# Patient Record
Sex: Female | Born: 2000 | Race: Black or African American | Hispanic: No | Marital: Single | Smoking: Never smoker
Health system: Southern US, Community
[De-identification: ages and names within clinical notes are randomized; demographics above are authoritative.]

## PROBLEM LIST (undated history)

## (undated) DIAGNOSIS — J45909 Unspecified asthma, uncomplicated: Secondary | ICD-10-CM

## (undated) HISTORY — PX: CHOLECYSTECTOMY: SHX55

---

## 2020-01-01 ENCOUNTER — Other Ambulatory Visit: Payer: Self-pay

## 2020-01-01 ENCOUNTER — Encounter (HOSPITAL_COMMUNITY): Payer: Self-pay | Admitting: *Deleted

## 2020-01-01 ENCOUNTER — Emergency Department (HOSPITAL_COMMUNITY)
Admission: EM | Admit: 2020-01-01 | Discharge: 2020-01-01 | Disposition: A | Discharge status: Home / Self Care | Payer: Medicaid Other | Attending: Emergency Medicine | Admitting: Emergency Medicine

## 2020-01-01 DIAGNOSIS — R509 Fever, unspecified: Secondary | ICD-10-CM | POA: Diagnosis present

## 2020-01-01 DIAGNOSIS — R519 Headache, unspecified: Secondary | ICD-10-CM | POA: Diagnosis not present

## 2020-01-01 DIAGNOSIS — J069 Acute upper respiratory infection, unspecified: Secondary | ICD-10-CM

## 2020-01-01 NOTE — ED Provider Notes (Signed)
[facility]-EMERGENCY DEPT Provider Note   CSN: [id number] Arrival date & time: 01/01/20  1216     History Chief Complaint  Patient presents with  . Headache  . Fever  . Chills    Wendy Long is a 19 y.o. female.  19 year old female presents with subjective chills as well as fever.  Mild cephalgia.  Denies any sore throat, congestion.  No vomiting or severe diarrhea.  Completed her Covid vaccination 2 weeks ago.  Is currently asymptomatic.  Concern for possible exposure to Covid.  Denies any urinary tract symptoms        History reviewed. No pertinent past medical history.  There are no problems to display for this patient.   Past Surgical History:  Procedure Laterality Date  . CHOLECYSTECTOMY       OB History   No obstetric history on file.     No family history on file.  Social History   Tobacco Use  . Smoking status: Never Smoker  . Smokeless tobacco: Never Used  Substance Use Topics  . Alcohol use: Never  . Drug use: Never    Home Medications Prior to Admission medications   Not on File    Allergies    Patient has no known allergies.  Review of Systems   Review of Systems  All other systems reviewed and are negative.   Physical Exam Updated Vital Signs BP 129/73 (BP Location: Left Arm)   Pulse 76   Temp 99.1 F (37.3 C) (Oral)   Resp 16   Ht 1.702 m (5\' 7" )   Wt 72.6 kg   LMP 11/10/2019   SpO2 98%   BMI 25.06 kg/m   Physical Exam Vitals and nursing note reviewed.  Constitutional:      General: She is not in acute distress.    Appearance: Normal appearance. She is well-developed. She is not toxic-appearing.  HENT:     Head: Normocephalic and atraumatic.  Eyes:     General: Lids are normal.     Conjunctiva/sclera: Conjunctivae normal.     Pupils: Pupils are equal, round, and reactive to light.  Neck:     Thyroid: No thyroid mass.     Trachea: No tracheal deviation.  Cardiovascular:     Rate and  Rhythm: Normal rate and regular rhythm.     Heart sounds: Normal heart sounds. No murmur heard.  No gallop.   Pulmonary:     Effort: Pulmonary effort is normal. No respiratory distress.     Breath sounds: Normal breath sounds. No stridor. No decreased breath sounds, wheezing, rhonchi or rales.  Abdominal:     General: Bowel sounds are normal. There is no distension.     Palpations: Abdomen is soft.     Tenderness: There is no abdominal tenderness. There is no rebound.  Musculoskeletal:        General: No tenderness. Normal range of motion.     Cervical back: Normal range of motion and neck supple.  Skin:    General: Skin is warm and dry.     Findings: No abrasion or rash.  Neurological:     Mental Status: She is alert and oriented to person, place, and time.     GCS: GCS eye subscore is 4. GCS verbal subscore is 5. GCS motor subscore is 6.     Cranial Nerves: No cranial nerve deficit.     Sensory: No sensory deficit.  Psychiatric:        Speech: Speech  normal.        Behavior: Behavior normal.     ED Results / Procedures / Treatments   Labs (all labs ordered are listed, but only abnormal results are displayed) Labs Reviewed - No data to display  EKG None  Radiology No results found.  Procedures Procedures (including critical care time)  Medications Ordered in ED Medications - No data to display  ED Course  I have reviewed the triage vital signs and the nursing notes.  Pertinent labs & imaging results that were available during my care of the patient were reviewed by me and considered in my medical decision making (see chart for details).    MDM Rules/Calculators/A&P                          Patient without indication for Covid testing here.  She is normal vital signs.  Will discharge home with return precautions Final Clinical Impression(s) / ED Diagnoses Final diagnoses:  None    Rx / DC Orders ED Discharge Orders    None       Lorre Nick,  MD 01/01/20 1431

## 2020-01-01 NOTE — ED Triage Notes (Signed)
Reports fever (has not checked temp) chills and headache for 2 days

## 2020-02-08 ENCOUNTER — Encounter: Payer: Medicaid Other | Admitting: Advanced Practice Midwife

## 2020-02-08 LAB — OB RESULTS CONSOLE RPR: RPR: NONREACTIVE

## 2020-02-08 LAB — OB RESULTS CONSOLE HEPATITIS B SURFACE ANTIGEN: Hepatitis B Surface Ag: NEGATIVE

## 2020-02-08 LAB — OB RESULTS CONSOLE HIV ANTIBODY (ROUTINE TESTING): HIV: NONREACTIVE

## 2020-02-08 LAB — OB RESULTS CONSOLE GC/CHLAMYDIA
Chlamydia: NEGATIVE
Gonorrhea: NEGATIVE

## 2020-02-08 LAB — OB RESULTS CONSOLE RUBELLA ANTIBODY, IGM: Rubella: IMMUNE

## 2020-03-02 ENCOUNTER — Inpatient Hospital Stay (HOSPITAL_COMMUNITY)
Admission: AD | Admit: 2020-03-02 | Discharge: 2020-03-02 | Discharge status: Against Medical Advice | Payer: Medicaid Other | Attending: Obstetrics and Gynecology | Admitting: Obstetrics and Gynecology

## 2020-03-02 ENCOUNTER — Other Ambulatory Visit: Payer: Self-pay

## 2020-03-02 ENCOUNTER — Encounter (HOSPITAL_COMMUNITY): Payer: Self-pay | Admitting: Obstetrics and Gynecology

## 2020-03-02 DIAGNOSIS — Z3492 Encounter for supervision of normal pregnancy, unspecified, second trimester: Secondary | ICD-10-CM

## 2020-03-02 DIAGNOSIS — O26892 Other specified pregnancy related conditions, second trimester: Secondary | ICD-10-CM | POA: Diagnosis not present

## 2020-03-02 DIAGNOSIS — R109 Unspecified abdominal pain: Secondary | ICD-10-CM | POA: Diagnosis not present

## 2020-03-02 DIAGNOSIS — Z3A18 18 weeks gestation of pregnancy: Secondary | ICD-10-CM | POA: Diagnosis not present

## 2020-03-02 DIAGNOSIS — O26899 Other specified pregnancy related conditions, unspecified trimester: Secondary | ICD-10-CM

## 2020-03-02 DIAGNOSIS — Z3A00 Weeks of gestation of pregnancy not specified: Secondary | ICD-10-CM | POA: Diagnosis not present

## 2020-03-02 LAB — URINALYSIS, ROUTINE W REFLEX MICROSCOPIC
Bilirubin Urine: NEGATIVE
Glucose, UA: NEGATIVE mg/dL
Hgb urine dipstick: NEGATIVE
Ketones, ur: NEGATIVE mg/dL
Leukocytes,Ua: NEGATIVE
Nitrite: NEGATIVE
Protein, ur: NEGATIVE mg/dL
Specific Gravity, Urine: 1.019 (ref 1.005–1.030)
pH: 6 (ref 5.0–8.0)

## 2020-03-02 NOTE — MAU Note (Signed)
Patient reports some excessive abdominal cramping that started 2-3 hours ago.  Denies any VB.  States she is [redacted] weeks pregnant.  Denies any complications w/ her pregnancy up to this point.

## 2020-03-02 NOTE — MAU Note (Signed)
Not enough urine for culture tube 

## 2020-03-02 NOTE — MAU Provider Note (Signed)
NP walked in the room approxiemtnly 50 mins after patients arrival to MAU. Patient stated " I think I am ready to leave, I feel much better".  She reports she felt a pain in her lower abdomen and panicked. She heard the fetal heart beat via doppler in MAU and feels relief. She declined any further workup.   Patient is very pleasant.  Vitals:   03/02/20 1811  BP: 117/64  Pulse: 86  Temp: 98.7 F (37.1 C)  Resp: 15  Weight: 70.5 kg    UA pending   A:  1. Abdominal pain during pregnancy in second trimester   2. Presence of fetal heart sounds in second trimester     P:  Patient signed out AMA Please return if you pain worsens MSE done.   Duane Lope, NP 03/02/2020 6:47 PM

## 2020-03-12 ENCOUNTER — Other Ambulatory Visit: Payer: Self-pay

## 2020-03-12 ENCOUNTER — Inpatient Hospital Stay (HOSPITAL_COMMUNITY)
Admission: AD | Admit: 2020-03-12 | Discharge: 2020-03-13 | Disposition: A | Discharge status: Home / Self Care | Payer: Medicaid Other | Attending: Obstetrics and Gynecology | Admitting: Obstetrics and Gynecology

## 2020-03-12 ENCOUNTER — Encounter (HOSPITAL_COMMUNITY): Payer: Self-pay | Admitting: Obstetrics and Gynecology

## 2020-03-12 DIAGNOSIS — Z3492 Encounter for supervision of normal pregnancy, unspecified, second trimester: Secondary | ICD-10-CM

## 2020-03-12 DIAGNOSIS — W19XXXA Unspecified fall, initial encounter: Secondary | ICD-10-CM

## 2020-03-12 HISTORY — DX: Unspecified asthma, uncomplicated: J45.909

## 2020-03-12 NOTE — MAU Note (Signed)
..  Wendy Long is a 20 y.o. at [redacted]w[redacted]d here in MAU reporting: A fall at around 10pm where she hit her stomach. She now has lower abdominal pain.  Denies vaginal bleeding.   Pain score: 2/10 Vitals:   03/12/20 2320  BP: 121/68  Pulse: 88  Resp: 14  Temp: 99.1 F (37.3 C)  SpO2: 100%

## 2020-03-13 DIAGNOSIS — Z3A17 17 weeks gestation of pregnancy: Secondary | ICD-10-CM

## 2020-03-13 DIAGNOSIS — O99891 Other specified diseases and conditions complicating pregnancy: Secondary | ICD-10-CM

## 2020-03-13 DIAGNOSIS — W19XXXA Unspecified fall, initial encounter: Secondary | ICD-10-CM

## 2020-03-13 DIAGNOSIS — Z3689 Encounter for other specified antenatal screening: Secondary | ICD-10-CM | POA: Diagnosis not present

## 2020-03-13 DIAGNOSIS — Z3492 Encounter for supervision of normal pregnancy, unspecified, second trimester: Secondary | ICD-10-CM | POA: Diagnosis present

## 2020-03-13 NOTE — MAU Provider Note (Cosign Needed)
Event Date/Time   First Provider Initiated Contact with Patient 03/13/20 0000   S Wendy Long is a 20 y.o. G1P0 pregnant female at [redacted]w[redacted]d who presents to MAU today with complaint of a recent fall where she landed mostly on her hands and knees but also hit her belly a little on the right side. She denies any abdominal pain or cramping, vaginal bleeding or discharge.   O BP 121/68 (BP Location: Right Arm)   Pulse 88   Temp 99.1 F (37.3 C)   Resp 14   Ht 5\' 7"  (1.702 m)   Wt 159 lb 9.6 oz (72.4 kg)   LMP 11/10/2019   SpO2 100%   BMI 25.00 kg/m  Physical Exam Vitals and nursing note reviewed.  Constitutional:      General: She is not in acute distress.    Appearance: Normal appearance. She is normal weight. She is not ill-appearing.  HENT:     Head: Normocephalic.     Mouth/Throat:     Mouth: Mucous membranes are moist.  Eyes:     Pupils: Pupils are equal, round, and reactive to light.  Cardiovascular:     Rate and Rhythm: Normal rate and regular rhythm.     Pulses: Normal pulses.  Pulmonary:     Effort: Pulmonary effort is normal.  Abdominal:     Palpations: Abdomen is soft.  Musculoskeletal:        General: No swelling, tenderness or signs of injury. Normal range of motion.     Cervical back: Normal range of motion.  Skin:    General: Skin is warm and dry.     Capillary Refill: Capillary refill takes less than 2 seconds.     Findings: No bruising or erythema.  Neurological:     Mental Status: She is alert and oriented to person, place, and time.  Psychiatric:        Mood and Affect: Mood normal.        Behavior: Behavior normal.        Thought Content: Thought content normal.        Judgment: Judgment normal.    FHT: 155  A Fall, initial encounter Fetal heart tones present, first trimester Medical screening exam complete  P Discharge from MAU in stable condition with preterm labor precautions Follow up with [facility] for ongoing  prenatal care See AVS for additional patient education  [facility], Bernerd Limbo 03/13/2020 12:24 AM

## 2020-03-13 NOTE — Discharge Instructions (Signed)
Preventing Injuries During Pregnancy  Injuries can happen during pregnancy. Minor falls and accidents usually do not harm you or your baby. But some injuries can harm you and your baby. Tell your doctor about any injury you suffer. What can I do to avoid injuries? Safety  Remove rugs and loose objects on the floor.  Wear comfortable shoes that have a good grip. Do not wear shoes that have high heels.  Always wear your seat belt in the car. The lap belt should be below your belly. Always drive safely.  Do not ride on a motorcycle. Activity  Do not take part in rough and violent activities or sports.  Avoid: ? Walking on wet or slippery floors. ? Lifting heavy pots of boiling or hot liquids. ? Fixing electrical problems. ? Being near fires. General instructions  Take over-the-counter and prescription medicines only as told by your doctor.  Know your blood type and the blood type of the baby's father.  If you are a victim of domestic violence: ? Call your local emergency services (911 in the [place] ? Contact the National Domestic Violence Hotline for help and support. Get help right away if:  You fall on your belly or receive any serious blow to your belly.  You have a stiff neck or neck pain after a fall or an injury.  You get a headache or have problems with vision after an injury.  You do not feel the baby move or the baby is not moving as much as normal.  You have been a victim of domestic violence or any other kind of attack.  You have been in a car accident.  You have bleeding from your vagina.  Fluid is leaking from your vagina.  You start to have cramping or pain in your belly (contractions).  You have very bad pain in your lower back.  You feel weak or pass out (faint).  You start to throw up (vomit) after an injury.  You have been burned. Summary  Some injuries that happen during pregnancy can do harm to the baby.  Tell your doctor about any  injury.  Take steps to avoid injury. This includes removing rugs and loose objects on the floor. Always wear your seat belt in the car.  Do not take part in rough and violent activities or sports.  Get help right away if you have any serious accident or injury. This information is not intended to replace advice given to you by your health care provider. Make sure you discuss any questions you have with your health care provider. Document Revised: 11/21/2018 Document Reviewed: 03/07/2016 Elsevier Patient Education  2020 Elsevier Inc.  

## 2020-04-04 ENCOUNTER — Encounter: Payer: Self-pay | Admitting: *Deleted

## 2020-04-04 ENCOUNTER — Encounter: Payer: Self-pay | Admitting: Obstetrics & Gynecology

## 2020-04-05 ENCOUNTER — Other Ambulatory Visit: Payer: Self-pay | Admitting: Obstetrics and Gynecology

## 2020-04-05 DIAGNOSIS — O365999 Maternal care for other known or suspected poor fetal growth, unspecified trimester, other fetus: Secondary | ICD-10-CM

## 2020-04-06 ENCOUNTER — Other Ambulatory Visit: Payer: Self-pay | Admitting: Obstetrics

## 2020-04-06 ENCOUNTER — Other Ambulatory Visit: Payer: Self-pay

## 2020-04-06 ENCOUNTER — Ambulatory Visit: Payer: Medicaid Other | Admitting: *Deleted

## 2020-04-06 ENCOUNTER — Ambulatory Visit: Payer: Medicaid Other | Attending: Obstetrics and Gynecology

## 2020-04-06 ENCOUNTER — Other Ambulatory Visit: Payer: Self-pay | Admitting: *Deleted

## 2020-04-06 ENCOUNTER — Ambulatory Visit: Payer: Medicaid Other

## 2020-04-06 ENCOUNTER — Encounter: Payer: Self-pay | Admitting: *Deleted

## 2020-04-06 VITALS — BP 122/71 | HR 81

## 2020-04-06 DIAGNOSIS — O365990 Maternal care for other known or suspected poor fetal growth, unspecified trimester, not applicable or unspecified: Secondary | ICD-10-CM | POA: Diagnosis present

## 2020-04-06 DIAGNOSIS — Z862 Personal history of diseases of the blood and blood-forming organs and certain disorders involving the immune mechanism: Secondary | ICD-10-CM | POA: Diagnosis not present

## 2020-04-06 DIAGNOSIS — Z3A19 19 weeks gestation of pregnancy: Secondary | ICD-10-CM | POA: Diagnosis not present

## 2020-04-06 DIAGNOSIS — O365999 Maternal care for other known or suspected poor fetal growth, unspecified trimester, other fetus: Secondary | ICD-10-CM | POA: Diagnosis present

## 2020-04-06 DIAGNOSIS — O365920 Maternal care for other known or suspected poor fetal growth, second trimester, not applicable or unspecified: Secondary | ICD-10-CM

## 2020-04-06 DIAGNOSIS — Z3492 Encounter for supervision of normal pregnancy, unspecified, second trimester: Secondary | ICD-10-CM

## 2020-05-09 ENCOUNTER — Other Ambulatory Visit: Payer: Self-pay

## 2020-05-09 ENCOUNTER — Ambulatory Visit: Payer: Medicaid Other | Admitting: *Deleted

## 2020-05-09 ENCOUNTER — Ambulatory Visit: Payer: Medicaid Other | Attending: Obstetrics

## 2020-05-09 ENCOUNTER — Encounter: Payer: Self-pay | Admitting: *Deleted

## 2020-05-09 VITALS — BP 115/64 | HR 96 | Temp 98.9°F

## 2020-05-09 DIAGNOSIS — O365920 Maternal care for other known or suspected poor fetal growth, second trimester, not applicable or unspecified: Secondary | ICD-10-CM

## 2020-05-09 DIAGNOSIS — Z3687 Encounter for antenatal screening for uncertain dates: Secondary | ICD-10-CM | POA: Diagnosis present

## 2020-05-09 DIAGNOSIS — O99012 Anemia complicating pregnancy, second trimester: Secondary | ICD-10-CM | POA: Insufficient documentation

## 2020-05-09 DIAGNOSIS — Z3492 Encounter for supervision of normal pregnancy, unspecified, second trimester: Secondary | ICD-10-CM | POA: Diagnosis not present

## 2020-05-09 DIAGNOSIS — D573 Sickle-cell trait: Secondary | ICD-10-CM | POA: Diagnosis not present

## 2020-05-09 DIAGNOSIS — Z3A24 24 weeks gestation of pregnancy: Secondary | ICD-10-CM | POA: Insufficient documentation

## 2020-05-09 NOTE — Progress Notes (Signed)
States"exposed to flu, has cough, runny nose, and headache."

## 2020-06-05 ENCOUNTER — Other Ambulatory Visit: Payer: Self-pay

## 2020-06-05 ENCOUNTER — Encounter (HOSPITAL_COMMUNITY): Payer: Self-pay | Admitting: Obstetrics & Gynecology

## 2020-06-05 ENCOUNTER — Inpatient Hospital Stay (HOSPITAL_COMMUNITY)
Admission: AD | Admit: 2020-06-05 | Discharge: 2020-06-05 | Disposition: A | Discharge status: Home / Self Care | Payer: Medicaid Other | Attending: Obstetrics & Gynecology | Admitting: Obstetrics & Gynecology

## 2020-06-05 DIAGNOSIS — Z3A27 27 weeks gestation of pregnancy: Secondary | ICD-10-CM | POA: Diagnosis not present

## 2020-06-05 DIAGNOSIS — O99612 Diseases of the digestive system complicating pregnancy, second trimester: Secondary | ICD-10-CM

## 2020-06-05 DIAGNOSIS — K529 Noninfective gastroenteritis and colitis, unspecified: Secondary | ICD-10-CM | POA: Insufficient documentation

## 2020-06-05 DIAGNOSIS — O219 Vomiting of pregnancy, unspecified: Secondary | ICD-10-CM

## 2020-06-05 DIAGNOSIS — O212 Late vomiting of pregnancy: Secondary | ICD-10-CM

## 2020-06-05 DIAGNOSIS — O218 Other vomiting complicating pregnancy: Secondary | ICD-10-CM | POA: Insufficient documentation

## 2020-06-05 LAB — URINALYSIS, ROUTINE W REFLEX MICROSCOPIC
Bilirubin Urine: NEGATIVE
Glucose, UA: NEGATIVE mg/dL
Hgb urine dipstick: NEGATIVE
Ketones, ur: 20 mg/dL — AB
Leukocytes,Ua: NEGATIVE
Nitrite: NEGATIVE
Protein, ur: NEGATIVE mg/dL
Specific Gravity, Urine: 1.012 (ref 1.005–1.030)
pH: 6 (ref 5.0–8.0)

## 2020-06-05 MED ORDER — ONDANSETRON HCL 4 MG PO TABS: 4.0000 mg | ORAL_TABLET | Freq: Three times a day (TID) | ORAL | 0 refills | Status: DC | PRN

## 2020-06-05 NOTE — MAU Note (Signed)
Pt presents to unit via EMS with complaint of n/v/d that started about 1 hour ago after receiving food from friend. Pt states n/v/d has since relieved after receiving 4 mg of Zofran and 1000 mL fluid bolus. No vaginal bleeding or LOF reported. Reports DFM.

## 2020-06-05 NOTE — MAU Provider Note (Signed)
Chief Complaint:  Nausea and Emesis   Event Date/Time   First Provider Initiated Contact with Patient 06/05/20 1648      HPI: Wendy Long is a 20 y.o. G1P0 at [redacted]w[redacted]d who presents to maternity admissions reporting onset of nausea with vomiting x 4-5 and diarrhea today around 3 pm.  She ate food her friend brought over at around 11 am then started feeling nauseous around 3.  She vomited x 2, then got in the shower and continued to have vomiting with new onset of diarrhea.  She came to MAU via EMS.  EMS started IV fluids and gave IV Zofran. Upon arrival, pt denies any nausea, vomiting, or diarrhea. She reports she had upper abdominal pain while she was vomiting but denies any pain now. There are no other symptoms.   She reports good fetal movement.   HPI  Past Medical History: Past Medical History:  Diagnosis Date  . Asthma    no medications    Past obstetric history: OB History  Gravida Para Term Preterm AB Living  1            SAB IAB Ectopic Multiple Live Births               # Outcome Date GA Lbr Len/2nd Weight Sex Delivery Anes PTL Lv  1 Current             Past Surgical History: Past Surgical History:  Procedure Laterality Date  . CHOLECYSTECTOMY      Family History: Family History  Problem Relation Age of Onset  . Depression Mother   . Diabetes Mother   . Hypertension Mother     Social History: Social History   Tobacco Use  . Smoking status: Never Smoker  . Smokeless tobacco: Never Used  Vaping Use  . Vaping Use: Never used  Substance Use Topics  . Alcohol use: Never  . Drug use: Never    Allergies: No Known Allergies  Meds:  Medications Prior to Admission  Medication Sig Dispense Refill Last Dose  . Prenatal Vit-Fe Fumarate-FA (PRENATAL VITAMINS PO) Take 1 tablet by mouth daily.   Past Week at Unknown time    ROS:  Review of Systems  Constitutional: Negative for chills, fatigue and fever.  Eyes: Negative for visual disturbance.  Respiratory:  Negative for shortness of breath.   Cardiovascular: Negative for chest pain.  Gastrointestinal: Positive for abdominal pain, diarrhea, nausea and vomiting.  Genitourinary: Negative for difficulty urinating, dysuria, flank pain, pelvic pain, vaginal bleeding, vaginal discharge and vaginal pain.  Neurological: Negative for dizziness and headaches.  Psychiatric/Behavioral: Negative.      I have reviewed patient's Past Medical Hx, Surgical Hx, Family Hx, Social Hx, medications and allergies.   Physical Exam   Patient Vitals for the past 24 hrs:  BP Temp Temp src Pulse Resp SpO2 Height Weight  06/05/20 1701 - - - - - 100 % - -  06/05/20 1656 - - - 90 - 100 % - -  06/05/20 1651 - - - - - 100 % - -  06/05/20 1646 - - - - - 100 % - -  06/05/20 1640 - - - - - 100 % - -  06/05/20 1639 - (!) 97.2 F (36.2 C) Axillary - - 100 % - -  06/05/20 1638 117/68 97.8 F (36.6 C) Oral 80 18 100 % - -  06/05/20 1636 - - - - - 100 % - -  06/05/20 1630 - - - - -  100 % - -  06/05/20 1626 122/71 - - 84 - 100 % - -  06/05/20 1624 122/71 (!) 97.5 F (36.4 C) Oral 91 16 100 %  (1.702 m) 74.4 kg   Constitutional: Well-developed, well-nourished female in no acute distress.  Cardiovascular: normal rate Respiratory: normal effort GI: Abd soft, non-tender, gravid appropriate for gestational age.  MS: Extremities nontender, no edema, normal ROM Neurologic: Alert and oriented x 4.  GU: Neg CVAT.  PELVIC EXAM: Deferred     FHT:  Baseline 140, moderate variability, accelerations present, no decelerations Contractions: None on toco or to palpation   Labs: Results for orders placed or performed during the hospital encounter of 06/05/20 (from the past 24 hour(s))  Urinalysis, Routine w reflex microscopic Urine, Clean Catch     Status: Abnormal   Collection Time: 06/05/20  5:27 PM  Result Value Ref Range   Color, Urine YELLOW YELLOW   APPearance CLEAR CLEAR   Specific Gravity, Urine 1.012 1.005 - 1.030    pH 6.0 5.0 - 8.0   Glucose, UA NEGATIVE NEGATIVE mg/dL   Hgb urine dipstick NEGATIVE NEGATIVE   Bilirubin Urine NEGATIVE NEGATIVE   Ketones, ur 20 (A) NEGATIVE mg/dL   Protein, ur NEGATIVE NEGATIVE mg/dL   Nitrite NEGATIVE NEGATIVE   Leukocytes,Ua NEGATIVE NEGATIVE      Imaging:  [place] MFM OB FOLLOW UP  Result Date: 05/09/2020 ----------------------------------------------------------------------  OBSTETRICS REPORT                       (Signed Final 05/09/2020 10:55 am) ---------------------------------------------------------------------- Patient Info  ID #:       [id number]                          D.O.B.:  13-May-2000 (19 yrs)  Name:       Wendy Long                  Visit Date: 05/09/2020 10:01 am ---------------------------------------------------------------------- Performed By  Attending:        Noralee Space MD        Ref. Address:     [facility]                                                             Gynecology                                                             [address]                                                             [address]                                                             [place], [place]                                                             [address]  Performed By:     Reinaldo Raddle            [facility]  Referred By:      Nigel Bridgeman                    CNM ---------------------------------------------------------------------- Orders  #  Description                           Code        Ordered By  1  [place] MFM OB FOLLOW UP                   51761.60    Rosana Hoes  ----------------------------------------------------------------------  #  Order #                     Accession #                Episode #  1  [id number]                   [id number]                 [id number] ---------------------------------------------------------------------- Indications  [redacted] weeks gestation of pregnancy  Z3A.24  Maternal care for known or suspected poor      O36.5921  fetal growth, second trimester, fetus 1 IUGR  (on outside office scan)  History of sickle cell trait                   Z86.2  Low-risk NIPS ---------------------------------------------------------------------- Fetal Evaluation  Num Of Fetuses:         1  Fetal Heart Rate(bpm):  157  Cardiac Activity:       Observed  Presentation:           Cephalic  Placenta:               Anterior  P. Cord Insertion:      Visualized  Amniotic Fluid  AFI FV:      Within normal limits                              Largest Pocket(cm)                              4.23 ---------------------------------------------------------------------- Biometry  BPD:      58.1  mm     G. Age:  23w 6d         36  %    CI:        72.35   %    70 - 86                                                          FL/HC:      20.2   %    18.7 - 20.9  HC:      217.3  mm     G. Age:  23w 5d         24  %    HC/AC:      1.12        1.05 - 1.21  AC:      193.6  mm     G. Age:  24w 0d         43  %    FL/BPD:     75.7   %    71 - 87  FL:         44  mm     G. Age:  24w 3d         53  %    FL/AC:      22.7   %    20 - 24  LV:        3.4  mm  Est. FW:     668  gm      1 lb 8 oz     49  % ---------------------------------------------------------------------- OB History  Gravidity:    1         Term:   0        Prem:   0        SAB:   0  TOP:          0       Ectopic:  0        Living: 0 ---------------------------------------------------------------------- Gestational Age  LMP:  25w 6d        Date:  11/10/19                 EDD:   08/16/20  U/S Today:     24w 0d                                         EDD:   08/29/20  Best:          24w 0d     Det. By:  U/S  (04/06/20)          EDD:   08/29/20 ---------------------------------------------------------------------- Anatomy  Cranium:               Appears normal         Aortic Arch:            Previously seen  Cavum:                 Appears normal         Ductal Arch:            Previously seen  Ventricles:            Appears normal         Diaphragm:              Appears normal  Choroid Plexus:        Previously seen        Stomach:                Appears normal, left                                                                        sided  Cerebellum:            Previously seen        Abdomen:                Appears normal  Posterior Fossa:       Previously seen        Abdominal Wall:         Previously seen  Nuchal Fold:           Previously seen        Cord Vessels:           Previously seen  Face:                  Orbits and profile     Kidneys:                Appear normal                         previously seen  Lips:                  Previously seen        Bladder:                Appears normal  Thoracic:              Appears normal  Spine:                  Previously seen  Heart:                 Previously seen        Upper Extremities:      Previously seen  RVOT:                  Appears normal         Lower Extremities:      Previously seen  LVOT:                  Appears normal  Other:  SVC IVC prev appears normal. 3VV/T not well visualized. Heels prev          visualized. 5th digit prev visualized. Fetus appears to be female. ---------------------------------------------------------------------- Cervix Uterus Adnexa  Cervix  Not visualized (advanced GA >24wks)  Uterus  No abnormality visualized.  Right Ovary  Not visualized.  Left Ovary  Within normal limits.  Cul De Sac  No free fluid seen.  Adnexa  No adnexal mass visualized. ----------------------------------------------------------------------  Impression  Patient returned for completion of fetal anatomy .Fetal growth  is appropriate for gestational age .Good interval growth is  seen.Amniotic fluid is normal and good fetal activity is seen  .Fetal anatomical survey was completed and appears normal.  Patient reports she has "runny nose" and cough but tested  negative for COVID-19 infection.  She has sickle-cell trait. I recommended partner screening.  Patient opted not to have genetic counseling.  EDD: 08/29/2020 ---------------------------------------------------------------------- Recommendations  Follow-up scans as clinically indicated. ----------------------------------------------------------------------                  Noralee Space, MD Electronically Signed Final Report   05/09/2020 10:55 am ----------------------------------------------------------------------   MAU Course/MDM: Orders Placed This Encounter  Procedures  . Urinalysis, Routine w reflex microscopic Urine, Clean Catch  . Discharge patient    Meds ordered this encounter  Medications  . ondansetron (ZOFRAN) 4 MG tablet    Sig: Take 1 tablet (4 mg total) by mouth every 8 (eight) hours as needed for nausea or vomiting.    Dispense:  20 tablet    Refill:  0    Order Specific Question:   Supervising Provider    Answer:   Adam Phenix [[id number]]     NST reviewed and appropriate for gestational age  Pt without symptoms following IV fluids and antiemetics from EMS No contractions per pt, to palpation, or on toco Likely food borne gastroenteritis Continue to PO hydrate Rx for Zofran PO PRN F/U in office as scheduled Return to MAU as needed for emergencies  Assessment: 1. Gastroenteritis, acute   2. Nausea/vomiting in pregnancy   3. [redacted] weeks gestation of pregnancy     Plan: Discharge home Labor precautions and fetal kick counts  Follow-up Information    [facility] Follow up.   Specialty: Obstetrics and Gynecology Why: Follow up as  scheduled. Return to MAU as needed for emergencies. Contact information: [address] [place] [address] [phone]             Allergies as of 06/05/2020   No Known Allergies     Medication List    TAKE these medications   ondansetron 4 MG tablet Commonly known as: Zofran Take 1 tablet (4 mg total) by mouth every 8 (eight) hours as needed for nausea or  vomiting.   PRENATAL VITAMINS PO Take 1 tablet by mouth daily.       Sharen Counter Certified Nurse-Midwife 06/05/2020 5:46 PM

## 2020-08-09 LAB — OB RESULTS CONSOLE GBS: GBS: NEGATIVE

## 2020-08-25 ENCOUNTER — Encounter (HOSPITAL_COMMUNITY): Payer: Self-pay | Admitting: Obstetrics and Gynecology

## 2020-08-25 ENCOUNTER — Other Ambulatory Visit: Payer: Self-pay

## 2020-08-25 ENCOUNTER — Inpatient Hospital Stay (HOSPITAL_COMMUNITY)
Admission: AD | Admit: 2020-08-25 | Discharge: 2020-08-25 | Disposition: A | Discharge status: Home / Self Care | Payer: Medicaid Other | Attending: Obstetrics and Gynecology | Admitting: Obstetrics and Gynecology

## 2020-08-25 DIAGNOSIS — O471 False labor at or after 37 completed weeks of gestation: Secondary | ICD-10-CM | POA: Diagnosis not present

## 2020-08-25 DIAGNOSIS — Z3493 Encounter for supervision of normal pregnancy, unspecified, third trimester: Secondary | ICD-10-CM

## 2020-08-25 DIAGNOSIS — O26893 Other specified pregnancy related conditions, third trimester: Secondary | ICD-10-CM | POA: Diagnosis not present

## 2020-08-25 DIAGNOSIS — R109 Unspecified abdominal pain: Secondary | ICD-10-CM | POA: Diagnosis not present

## 2020-08-25 DIAGNOSIS — Z3A39 39 weeks gestation of pregnancy: Secondary | ICD-10-CM | POA: Diagnosis not present

## 2020-08-25 DIAGNOSIS — O368130 Decreased fetal movements, third trimester, not applicable or unspecified: Secondary | ICD-10-CM | POA: Insufficient documentation

## 2020-08-25 DIAGNOSIS — R1030 Lower abdominal pain, unspecified: Secondary | ICD-10-CM | POA: Insufficient documentation

## 2020-08-25 DIAGNOSIS — Z3689 Encounter for other specified antenatal screening: Secondary | ICD-10-CM

## 2020-08-25 LAB — URINALYSIS, ROUTINE W REFLEX MICROSCOPIC
Bilirubin Urine: NEGATIVE
Glucose, UA: NEGATIVE mg/dL
Hgb urine dipstick: NEGATIVE
Ketones, ur: NEGATIVE mg/dL
Nitrite: NEGATIVE
Protein, ur: NEGATIVE mg/dL
Specific Gravity, Urine: 1.011 (ref 1.005–1.030)
pH: 5 (ref 5.0–8.0)

## 2020-08-25 LAB — POCT FERN TEST: POCT Fern Test: NEGATIVE

## 2020-08-25 NOTE — MAU Provider Note (Signed)
History     CSN: [id number]  Arrival date and time: 08/25/20 1324   Event Date/Time   First Provider Initiated Contact with Patient 08/25/20 1402      Chief Complaint  Patient presents with   Abdominal Pain   HPI Wendy Long is a 20 y.o. G1P0 at [redacted]w[redacted]d who presents to MAU with chief complaint of lower abdominal cramping.  This is a new problem, onset this morning around 0300 and only occurring when patient adopts a reclined position. Then around 1100 she started to feel suprapubic cramping regardless of position. Pain score is 6/10. She denies aggravating or alleviating factors  Leaking of Fluid This is a new problem, onset at 2300 hours yesterday. Patient endorses continuous leaking of scant amounts of fluid. She has visualized drops on the floor when she stands after voiding.  Decreased Fetal Movement Patient states she has not felt fetal movement since 0500. Baby is typically active in the evening. She endorses active fetal movement on arrival to MAU. She has not eaten today.  Patient receives care with [facility].  OB History     Gravida  1   Para      Term      Preterm      AB      Living         SAB      IAB      Ectopic      Multiple      Live Births              Past Medical History:  Diagnosis Date   Asthma    no medications    Past Surgical History:  Procedure Laterality Date   CHOLECYSTECTOMY      Family History  Problem Relation Age of Onset   Depression Mother    Diabetes Mother    Hypertension Mother     Social History   Tobacco Use   Smoking status: Never   Smokeless tobacco: Never  Vaping Use   Vaping Use: Never used  Substance Use Topics   Alcohol use: Never   Drug use: Never    Allergies: No Known Allergies  Medications Prior to Admission  Medication Sig Dispense Refill Last Dose   Prenatal Vit-Fe Fumarate-FA (PRENATAL VITAMINS PO) Take 1 tablet by mouth daily.   08/24/2020   ondansetron (ZOFRAN) 4 MG tablet Take  1 tablet (4 mg total) by mouth every 8 (eight) hours as needed for nausea or vomiting. 20 tablet 0     Review of Systems  Gastrointestinal:  Positive for abdominal pain.  Genitourinary:  Positive for vaginal discharge.  All other systems reviewed and are negative. Physical Exam   Blood pressure 127/78, pulse 79, temperature 98.9 F (37.2 C), temperature source Oral, resp. rate 17, last menstrual period 11/10/2019, SpO2 99 %.  Physical Exam Vitals and nursing note reviewed. Exam conducted with a chaperone present.  Constitutional:      Appearance: She is well-developed.  Cardiovascular:     Rate and Rhythm: Normal rate.     Heart sounds: Normal heart sounds.  Pulmonary:     Effort: Pulmonary effort is normal.     Breath sounds: Normal breath sounds.  Abdominal:     Palpations: Abdomen is soft.     Tenderness: There is no abdominal tenderness.     Comments: Gravid  Genitourinary:    Comments: Pelvic exam: External genitalia normal, vaginal walls pink and well rugated, cervix visually closed, no lesions noted.  Skin:    General: Skin is warm and dry.     Capillary Refill: Capillary refill takes less than 2 seconds.  Neurological:     Mental Status: She is alert and oriented to person, place, and time.  Psychiatric:        Mood and Affect: Mood normal.        Behavior: Behavior normal.    MAU Course/MDM  Procedures  --Negative pooling, negative fern --Reactive tracing: baseline 135, mod var, + 15 x 15 accels, no decels --Toco: irregular contractions q 2-8 min --Patient endorses fetal movement on arrival to MAU and throughout evaluation. Given NST button once triage was completed. Push NST button 39 times during labor evaluation  Patient Vitals for the past 24 hrs:  BP Temp Temp src Pulse Resp SpO2  08/25/20 1512 129/73 -- -- 77 -- --  08/25/20 1402 127/78 -- -- 79 -- --  08/25/20 1357 131/86 98.9 F (37.2 C) Oral 85 17 99 %   Results for orders placed or performed  during the hospital encounter of 08/25/20 (from the past 24 hour(s))  Urinalysis, Routine w reflex microscopic Urine, Clean Catch     Status: Abnormal   Collection Time: 08/25/20  2:14 PM  Result Value Ref Range   Color, Urine YELLOW YELLOW   APPearance HAZY (A) CLEAR   Specific Gravity, Urine 1.011 1.005 - 1.030   pH 5.0 5.0 - 8.0   Glucose, UA NEGATIVE NEGATIVE mg/dL   Hgb urine dipstick NEGATIVE NEGATIVE   Bilirubin Urine NEGATIVE NEGATIVE   Ketones, ur NEGATIVE NEGATIVE mg/dL   Protein, ur NEGATIVE NEGATIVE mg/dL   Nitrite NEGATIVE NEGATIVE   Leukocytes,Ua LARGE (A) NEGATIVE   RBC / HPF 0-5 0 - 5 RBC/hpf   WBC, UA 6-10 0 - 5 WBC/hpf   Bacteria, UA FEW (A) NONE SEEN   Squamous Epithelial / LPF 0-5 0 - 5   Mucus PRESENT   Fern Test     Status: None   Collection Time: 08/25/20  3:33 PM  Result Value Ref Range   POCT Fern Test Negative = intact amniotic membranes    Assessment and Plan  --20 y.o. G1P0 at [redacted]w[redacted]d  --Reactive tracing --Intact amniotic sac --Cervix 1cm, up to 8 min between contractions --Pain score 0 prior to discharge --Discharge home in stable condition  Calvert Cantor, CNM 08/25/2020, 8:54 PM

## 2020-08-25 NOTE — MAU Note (Addendum)
...  Wendy Long is a 20 y.o. at [redacted]w[redacted]d here in MAU reporting: intermittent lower back and abdominal pain that began at 0300 this morning while lying down only. She states around 1100 this morning she began having intermittent pain in any position. Denies back pain currently but endorses the abdominal pain.  She states at 2300 yesterday she had leaking of fluid after using the restroom. She states she would get up from urinating and more would come out but that the leaking only happened in the restroom. DFM. States she has not felt the baby move since 0500. No VB. GBS-.  Last ate last night at 2130.  Lab orders placed from triage: UA

## 2020-08-27 LAB — CULTURE, OB URINE

## 2020-08-29 ENCOUNTER — Other Ambulatory Visit: Payer: Self-pay | Admitting: Obstetrics and Gynecology

## 2020-09-05 ENCOUNTER — Other Ambulatory Visit: Payer: Self-pay

## 2020-09-05 ENCOUNTER — Inpatient Hospital Stay (HOSPITAL_COMMUNITY): Payer: Medicaid Other | Admitting: Anesthesiology

## 2020-09-05 ENCOUNTER — Inpatient Hospital Stay (HOSPITAL_COMMUNITY)
Admission: AD | Admit: 2020-09-05 | Payer: Medicaid Other | Source: Home / Self Care | Admitting: Obstetrics and Gynecology

## 2020-09-05 ENCOUNTER — Encounter (HOSPITAL_COMMUNITY): Payer: Self-pay | Admitting: Obstetrics and Gynecology

## 2020-09-05 ENCOUNTER — Inpatient Hospital Stay (HOSPITAL_COMMUNITY): Payer: Medicaid Other | Attending: Obstetrics and Gynecology

## 2020-09-05 ENCOUNTER — Inpatient Hospital Stay (HOSPITAL_COMMUNITY)
Admission: AD | Admit: 2020-09-05 | Discharge: 2020-09-08 | DRG: 787 | Disposition: A | Discharge status: Home / Self Care | Payer: Medicaid Other | Attending: Obstetrics and Gynecology | Admitting: Obstetrics and Gynecology

## 2020-09-05 DIAGNOSIS — O9081 Anemia of the puerperium: Secondary | ICD-10-CM | POA: Diagnosis not present

## 2020-09-05 DIAGNOSIS — D573 Sickle-cell trait: Secondary | ICD-10-CM | POA: Diagnosis present

## 2020-09-05 DIAGNOSIS — Z3A41 41 weeks gestation of pregnancy: Secondary | ICD-10-CM | POA: Diagnosis not present

## 2020-09-05 DIAGNOSIS — O480 Post-term pregnancy: Principal | ICD-10-CM | POA: Diagnosis present

## 2020-09-05 DIAGNOSIS — Z20822 Contact with and (suspected) exposure to covid-19: Secondary | ICD-10-CM | POA: Diagnosis present

## 2020-09-05 DIAGNOSIS — Z98891 History of uterine scar from previous surgery: Secondary | ICD-10-CM

## 2020-09-05 DIAGNOSIS — D62 Acute posthemorrhagic anemia: Secondary | ICD-10-CM | POA: Diagnosis not present

## 2020-09-05 DIAGNOSIS — O26893 Other specified pregnancy related conditions, third trimester: Secondary | ICD-10-CM | POA: Diagnosis present

## 2020-09-05 LAB — RESP PANEL BY RT-PCR (FLU A&B, COVID) ARPGX2
Influenza A by PCR: NEGATIVE
Influenza B by PCR: NEGATIVE
SARS Coronavirus 2 by RT PCR: NEGATIVE

## 2020-09-05 LAB — RPR: RPR Ser Ql: NONREACTIVE

## 2020-09-05 LAB — CBC
HCT: 37.1 % (ref 36.0–46.0)
Hemoglobin: 12 g/dL (ref 12.0–15.0)
MCH: 27.7 pg (ref 26.0–34.0)
MCHC: 32.3 g/dL (ref 30.0–36.0)
MCV: 85.7 fL (ref 80.0–100.0)
Platelets: 154 10*3/uL (ref 150–400)
RBC: 4.33 MIL/uL (ref 3.87–5.11)
RDW: 13.6 % (ref 11.5–15.5)
WBC: 7.7 10*3/uL (ref 4.0–10.5)
nRBC: 0 % (ref 0.0–0.2)

## 2020-09-05 LAB — TYPE AND SCREEN
ABO/RH(D): B POS
Antibody Screen: NEGATIVE

## 2020-09-05 LAB — POCT FERN TEST: POCT Fern Test: POSITIVE

## 2020-09-05 MED ORDER — LACTATED RINGERS IV SOLN: 500.0000 mL | INTRAVENOUS | Status: DC | PRN

## 2020-09-05 MED ORDER — FENTANYL-BUPIVACAINE-NACL 0.5-0.125-0.9 MG/250ML-% EP SOLN: 12.0000 mL/h | EPIDURAL | Status: DC | PRN

## 2020-09-05 MED ORDER — ONDANSETRON HCL 4 MG/2ML IJ SOLN
4.0000 mg | Freq: Four times a day (QID) | INTRAMUSCULAR | Status: DC | PRN
  Administered 2020-09-06: 4 mg via INTRAVENOUS
  Filled 2020-09-05: qty 2

## 2020-09-05 MED ORDER — ACETAMINOPHEN 325 MG PO TABS: 650.0000 mg | ORAL_TABLET | ORAL | Status: DC | PRN

## 2020-09-05 MED ORDER — OXYTOCIN BOLUS FROM INFUSION: 333.0000 mL | Freq: Once | INTRAVENOUS | Status: DC

## 2020-09-05 MED ORDER — OXYTOCIN-SODIUM CHLORIDE 30-0.9 UT/500ML-% IV SOLN
2.5000 [IU]/h | INTRAVENOUS | Status: DC
  Filled 2020-09-05: qty 500

## 2020-09-05 MED ORDER — FENTANYL CITRATE (PF) 100 MCG/2ML IJ SOLN
50.0000 ug | INTRAMUSCULAR | Status: DC | PRN
  Administered 2020-09-05: 50 ug via INTRAVENOUS
  Filled 2020-09-05: qty 2

## 2020-09-05 MED ORDER — FLEET ENEMA 7-19 GM/118ML RE ENEM: 1.0000 | ENEMA | RECTAL | Status: DC | PRN

## 2020-09-05 MED ORDER — PHENYLEPHRINE 40 MCG/ML (10ML) SYRINGE FOR IV PUSH (FOR BLOOD PRESSURE SUPPORT): 80.0000 ug | PREFILLED_SYRINGE | INTRAVENOUS | Status: DC | PRN

## 2020-09-05 MED ORDER — LACTATED RINGERS IV SOLN: INTRAVENOUS | Status: DC

## 2020-09-05 MED ORDER — FENTANYL-BUPIVACAINE-NACL 0.5-0.125-0.9 MG/250ML-% EP SOLN
12.0000 mL/h | EPIDURAL | Status: DC | PRN
  Administered 2020-09-05: 12 mL/h via EPIDURAL

## 2020-09-05 MED ORDER — EPHEDRINE 5 MG/ML INJ: 10.0000 mg | INTRAVENOUS | Status: DC | PRN

## 2020-09-05 MED ORDER — SOD CITRATE-CITRIC ACID 500-334 MG/5ML PO SOLN
30.0000 mL | ORAL | Status: DC | PRN
  Administered 2020-09-06: 30 mL via ORAL
  Filled 2020-09-05: qty 30

## 2020-09-05 MED ORDER — DIPHENHYDRAMINE HCL 50 MG/ML IJ SOLN: 12.5000 mg | INTRAMUSCULAR | Status: DC | PRN

## 2020-09-05 MED ORDER — OXYCODONE-ACETAMINOPHEN 5-325 MG PO TABS: 2.0000 | ORAL_TABLET | ORAL | Status: DC | PRN

## 2020-09-05 MED ORDER — LIDOCAINE HCL (PF) 1 % IJ SOLN
INTRAMUSCULAR | Status: DC | PRN
  Administered 2020-09-05: 8 mL via EPIDURAL

## 2020-09-05 MED ORDER — OXYCODONE-ACETAMINOPHEN 5-325 MG PO TABS: 1.0000 | ORAL_TABLET | ORAL | Status: DC | PRN

## 2020-09-05 MED ORDER — FENTANYL-BUPIVACAINE-NACL 0.5-0.125-0.9 MG/250ML-% EP SOLN
EPIDURAL | Status: AC
  Filled 2020-09-05: qty 250

## 2020-09-05 MED ORDER — LIDOCAINE HCL (PF) 1 % IJ SOLN: 30.0000 mL | INTRAMUSCULAR | Status: DC | PRN

## 2020-09-05 MED ORDER — LACTATED RINGERS IV SOLN
500.0000 mL | Freq: Once | INTRAVENOUS | Status: AC
  Administered 2020-09-06: 500 mL via INTRAVENOUS

## 2020-09-05 NOTE — H&P (Signed)
OB ADMISSION/ HISTORY & PHYSICAL:  Admission Date: 09/05/2020  1:50 PM  Admit Diagnosis: Normal labor  Martia Dalby is a 20 y.o. female G1P0 [redacted]w[redacted]d presenting for labor eval. RN noted clear fluid and membranes at introitus. Positive ferning. Pt was unaware of SROM, but reports LOF since 0300 today. Endorses active FM, denies vaginal bleeding. Ctx began @ 0400 and has increased in intensity. Pt was scheduled for IOL today. Pregnancy was initially dated by LMP, biometrics performed during anatomy US changed EDC to 08/29/20.  History of current pregnancy: G1P0   Patient entered care with [facility] at 10 wks.   Anatomy scan:  19 wks, complete w/ anterior placenta.   Last evaluation: 37+1 wks, vertex/anterior placenta/ AFI 14.6/EFW 6# 6 oz (34%) Significant prenatal events:  Patient Active Problem List   Diagnosis Date Noted   Normal labor 09/05/2020  Hx of asthma  Prenatal Labs: ABO, Rh: --/--/B POS (06/27 1503) Antibody: NEG (06/27 1503) Rubella:   immune RPR:   NR HBsAg:   NR HIV:   NR GTT: passed 1 hr GBS:   neg GC/CHL: neg/neg Genetics: low-risk, neg AFP, normal Horizon Tdap/influenza vaccines: UTD on both   OB History  Gravida Para Term Preterm AB Living  1            SAB IAB Ectopic Multiple Live Births               # Outcome Date GA Lbr Len/2nd Weight Sex Delivery Anes PTL Lv  1 Current             Medical / Surgical History: Past medical history:  Past Medical History:  Diagnosis Date   Asthma    no medications    Past surgical history:  Past Surgical History:  Procedure Laterality Date   CHOLECYSTECTOMY     Family History:  Family History  Problem Relation Age of Onset   Depression Mother    Diabetes Mother    Hypertension Mother     Social History:  reports that she has never smoked. She has never used smokeless tobacco. She reports that she does not drink alcohol and does not use drugs.  Allergies: Patient has no known allergies.   Current  Medications at time of admission:  Prior to Admission medications   Medication Sig Start Date End Date Taking? Authorizing Provider  Prenatal Vit-Fe Fumarate-FA (PRENATAL VITAMINS PO) Take 1 tablet by mouth daily.   Yes [provider]  ondansetron (ZOFRAN) 4 MG tablet Take 1 tablet (4 mg total) by mouth every 8 (eight) hours as needed for nausea or vomiting. 06/05/20   Leftwich-Kirby, Wilmer Floor, CNM    Review of Systems: Constitutional: Negative   HENT: Negative   Eyes: Negative   Respiratory: Negative   Cardiovascular: Negative   Gastrointestinal: Negative  Genitourinary: neg for bloody show, pos for LOF   Musculoskeletal: Negative   Skin: Negative   Neurological: Negative   Endo/Heme/Allergies: Negative   Psychiatric/Behavioral: Negative    Physical Exam: VS: Blood pressure 125/80, pulse 77, temperature 98.5 F (36.9 C), temperature source Oral, resp. rate 17, height 5\' 7"  (1.702 m), weight 78.4 kg, last menstrual period 11/10/2019, SpO2 100 %. AAO x3, no signs of distress Cardiovascular: RRR Respiratory: Lung fields clear to ausculation GU/GI: Abdomen gravid, non-tender, non-distended, active FM, vertex, EFW 7# per Leopold's Extremities: no edema, negative for pain, tenderness, and cords  Cervical exam:Dilation: 4 Effacement (%): 80 Station: -1 Exam by:: [email or web address], CNM FHR: baseline rate  140 / variability moderate / accelerations present / absent decelerations TOCO: 2-4   Prenatal Transfer Tool  Maternal Diabetes: No Genetic Screening: Normal Maternal Ultrasounds/Referrals: Normal Fetal Ultrasounds or other Referrals:  Referred to Materal Fetal Medicine  for completion of anatomy and growth U/S Maternal Substance Abuse:  No Significant Maternal Medications:  None Significant Maternal Lab Results: Group B Strep negative    Assessment: 20 y.o. G1P0 [redacted]w[redacted]d  Latent stage of labor FHR category 1 GBS neg Pain management plan: IV sedation   Plan:  Admit  to L&D Routine admission orders Epidural PRN Expectant management. Agrees to Pitocin augmentation PRN Dr Su Hilt notified of admission and plan of care  Roma Schanz MSN, CNM 09/05/2020 4:53 PM

## 2020-09-05 NOTE — Anesthesia Procedure Notes (Signed)
Epidural Patient location during procedure: OB Start time: 09/05/2020 8:50 PM End time: 09/05/2020 8:56 PM  Staffing Anesthesiologist: Bethena Midget, MD  Preanesthetic Checklist Completed: patient identified, IV checked, site marked, risks and benefits discussed, surgical consent, monitors and equipment checked, pre-op evaluation and timeout performed  Epidural Patient position: sitting Prep: DuraPrep and site prepped and draped Patient monitoring: continuous pulse ox and blood pressure Approach: midline Location: L4-L5 Injection technique: LOR air  Needle:  Needle type: Tuohy  Needle gauge: 17 G Needle length: 9 cm and 9 Needle insertion depth: 6 cm Catheter type: closed end flexible Catheter size: 19 Gauge Catheter at skin depth: 12 cm Test dose: negative  Assessment Events: blood not aspirated, injection not painful, no injection resistance, no paresthesia and negative IV test

## 2020-09-05 NOTE — Progress Notes (Signed)
Subjective:    Not coping well w/ ctx and states that IV sedation was not effective for pain relief. Considering epidural and agrees to Pitocin if/when she gets an epidural. Plan to recheck after 2300 and start Pitocin if no change, pt agrees.   Objective:    VS: BP 125/75   Pulse 74   Temp 98.5 F (36.9 C) (Oral)   Resp 17   Ht 5\' 7"  (1.702 m)   Wt 78.4 kg   LMP 11/10/2019   SpO2 100%   BMI 27.06 kg/m  FHR : baseline 135 / variability moderate / accelerations present / absent decelerations Toco: contractions every 1-3 minutes  Membranes: SROM x17 hrs, remains clear Dilation: 4 Effacement (%): 80 Station: -1 Presentation: Vertex Exam by:: [email or web address], CNM   Assessment/Plan:   20 y.o. G1P0 [redacted]w[redacted]d Normal labor SROM  Labor:  latent labor Fetal Wellbeing:  Category I Pain Control:  IV pain meds I/D:   GBS neg Anticipated MOD:  NSVD  [redacted]w[redacted]d MSN, CNM 09/05/2020 8:25 PM

## 2020-09-05 NOTE — Anesthesia Preprocedure Evaluation (Signed)
Anesthesia Evaluation  Patient identified by MRN, date of birth, ID band Patient awake    Reviewed: Allergy & Precautions, H&P , NPO status , Patient's Chart, lab work & pertinent test results, reviewed documented beta blocker date and time   Airway Mallampati: I  TM Distance: >3 FB Neck ROM: full    Dental no notable dental hx. (+) Teeth Intact, Dental Advisory Given   Pulmonary neg pulmonary ROS,    Pulmonary exam normal breath sounds clear to auscultation       Cardiovascular negative cardio ROS Normal cardiovascular exam Rhythm:regular Rate:Normal     Neuro/Psych negative neurological ROS  negative psych ROS   GI/Hepatic negative GI ROS, Neg liver ROS,   Endo/Other  negative endocrine ROS  Renal/GU negative Renal ROS  negative genitourinary   Musculoskeletal   Abdominal   Peds  Hematology negative hematology ROS (+)   Anesthesia Other Findings   Reproductive/Obstetrics (+) Pregnancy                             Anesthesia Physical Anesthesia Plan  ASA: 2  Anesthesia Plan: Epidural   Post-op Pain Management:    Induction:   PONV Risk Score and Plan: 2  Airway Management Planned: Natural Airway  Additional Equipment: None  Intra-op Plan:   Post-operative Plan:   Informed Consent: I have reviewed the patients History and Physical, chart, labs and discussed the procedure including the risks, benefits and alternatives for the proposed anesthesia with the patient or authorized representative who has indicated his/her understanding and acceptance.       Plan Discussed with: Anesthesiologist and CRNA  Anesthesia Plan Comments:         Anesthesia Quick Evaluation  

## 2020-09-05 NOTE — MAU Note (Signed)
Presents with c/o ctxs 10 minutes apart that began this morning @ 0400.  Denies VB or LOF, losing mucus plug.  Endorses +FM.

## 2020-09-06 ENCOUNTER — Encounter (HOSPITAL_COMMUNITY)
Admission: AD | Disposition: A | Discharge status: Home / Self Care | Payer: Self-pay | Source: Home / Self Care | Attending: Obstetrics and Gynecology

## 2020-09-06 ENCOUNTER — Encounter (HOSPITAL_COMMUNITY): Payer: Self-pay | Admitting: Obstetrics and Gynecology

## 2020-09-06 SURGERY — Surgical Case
Anesthesia: Epidural

## 2020-09-06 MED ORDER — ACETAMINOPHEN 500 MG PO TABS
1000.0000 mg | ORAL_TABLET | Freq: Four times a day (QID) | ORAL | Status: AC
  Administered 2020-09-06 – 2020-09-07 (×3): 1000 mg via ORAL
  Filled 2020-09-06 (×3): qty 2

## 2020-09-06 MED ORDER — NALBUPHINE HCL 10 MG/ML IJ SOLN: 5.0000 mg | Freq: Once | INTRAMUSCULAR | Status: DC | PRN

## 2020-09-06 MED ORDER — MORPHINE SULFATE (PF) 0.5 MG/ML IJ SOLN
INTRAMUSCULAR | Status: DC | PRN
  Administered 2020-09-06: 3 mg via EPIDURAL

## 2020-09-06 MED ORDER — KETOROLAC TROMETHAMINE 30 MG/ML IJ SOLN
30.0000 mg | Freq: Four times a day (QID) | INTRAMUSCULAR | Status: AC
  Administered 2020-09-06 – 2020-09-07 (×3): 30 mg via INTRAVENOUS
  Filled 2020-09-06 (×3): qty 1

## 2020-09-06 MED ORDER — LIDOCAINE-EPINEPHRINE (PF) 2 %-1:200000 IJ SOLN
INTRAMUSCULAR | Status: DC | PRN
  Administered 2020-09-06: 10 mL via EPIDURAL
  Administered 2020-09-06: 5 mL via EPIDURAL

## 2020-09-06 MED ORDER — SIMETHICONE 80 MG PO CHEW
80.0000 mg | CHEWABLE_TABLET | Freq: Three times a day (TID) | ORAL | Status: DC
  Administered 2020-09-06 – 2020-09-08 (×5): 80 mg via ORAL
  Filled 2020-09-06 (×5): qty 1

## 2020-09-06 MED ORDER — SCOPOLAMINE 1 MG/3DAYS TD PT72
1.0000 | MEDICATED_PATCH | Freq: Once | TRANSDERMAL | Status: DC
  Administered 2020-09-06: 1.5 mg via TRANSDERMAL

## 2020-09-06 MED ORDER — TETANUS-DIPHTH-ACELL PERTUSSIS 5-2.5-18.5 LF-MCG/0.5 IM SUSY: 0.5000 mL | PREFILLED_SYRINGE | Freq: Once | INTRAMUSCULAR | Status: DC

## 2020-09-06 MED ORDER — OXYTOCIN-SODIUM CHLORIDE 30-0.9 UT/500ML-% IV SOLN: 2.5000 [IU]/h | INTRAVENOUS | Status: AC

## 2020-09-06 MED ORDER — TERBUTALINE SULFATE 1 MG/ML IJ SOLN
0.2500 mg | Freq: Once | INTRAMUSCULAR | Status: AC | PRN
  Administered 2020-09-06: 0.25 mg via SUBCUTANEOUS
  Filled 2020-09-06: qty 1

## 2020-09-06 MED ORDER — ONDANSETRON HCL 4 MG/2ML IJ SOLN
INTRAMUSCULAR | Status: DC | PRN
  Administered 2020-09-06: 4 mg via INTRAVENOUS

## 2020-09-06 MED ORDER — PRENATAL MULTIVITAMIN CH
1.0000 | ORAL_TABLET | Freq: Every day | ORAL | Status: DC
  Administered 2020-09-07 – 2020-09-08 (×2): 1 via ORAL
  Filled 2020-09-06 (×2): qty 1

## 2020-09-06 MED ORDER — LACTATED RINGERS IV SOLN: INTRAVENOUS | Status: DC

## 2020-09-06 MED ORDER — ONDANSETRON HCL 4 MG/2ML IJ SOLN: 4.0000 mg | Freq: Three times a day (TID) | INTRAMUSCULAR | Status: DC | PRN

## 2020-09-06 MED ORDER — OXYTOCIN-SODIUM CHLORIDE 30-0.9 UT/500ML-% IV SOLN
INTRAVENOUS | Status: AC
  Filled 2020-09-06: qty 500

## 2020-09-06 MED ORDER — KETOROLAC TROMETHAMINE 30 MG/ML IJ SOLN
30.0000 mg | Freq: Once | INTRAMUSCULAR | Status: AC | PRN
  Administered 2020-09-06: 30 mg via INTRAVENOUS

## 2020-09-06 MED ORDER — SODIUM CHLORIDE 0.9 % IV SOLN
INTRAVENOUS | Status: AC
  Filled 2020-09-06: qty 500

## 2020-09-06 MED ORDER — ACETAMINOPHEN 10 MG/ML IV SOLN: 1000.0000 mg | Freq: Once | INTRAVENOUS | Status: DC | PRN

## 2020-09-06 MED ORDER — DIPHENHYDRAMINE HCL 25 MG PO CAPS
25.0000 mg | ORAL_CAPSULE | ORAL | Status: DC | PRN
  Filled 2020-09-06 (×2): qty 1

## 2020-09-06 MED ORDER — NALBUPHINE HCL 10 MG/ML IJ SOLN: 5.0000 mg | INTRAMUSCULAR | Status: DC | PRN

## 2020-09-06 MED ORDER — SODIUM CHLORIDE 0.9 % IV SOLN
INTRAVENOUS | Status: DC | PRN
  Administered 2020-09-06: 500 mg via INTRAVENOUS

## 2020-09-06 MED ORDER — OXYCODONE HCL 5 MG PO TABS
5.0000 mg | ORAL_TABLET | ORAL | Status: DC | PRN
Start: 2020-09-06 — End: 2020-09-08
  Administered 2020-09-08: 5 mg via ORAL
  Filled 2020-09-06: qty 1

## 2020-09-06 MED ORDER — IBUPROFEN 600 MG PO TABS
600.0000 mg | ORAL_TABLET | Freq: Four times a day (QID) | ORAL | Status: DC
  Administered 2020-09-07 – 2020-09-08 (×5): 600 mg via ORAL
  Filled 2020-09-06 (×5): qty 1

## 2020-09-06 MED ORDER — MENTHOL 3 MG MT LOZG: 1.0000 | LOZENGE | OROMUCOSAL | Status: DC | PRN

## 2020-09-06 MED ORDER — STERILE WATER FOR IRRIGATION IR SOLN
Status: DC | PRN
  Administered 2020-09-06: 1

## 2020-09-06 MED ORDER — WITCH HAZEL-GLYCERIN EX PADS: 1.0000 "application " | MEDICATED_PAD | CUTANEOUS | Status: DC | PRN

## 2020-09-06 MED ORDER — KETOROLAC TROMETHAMINE 30 MG/ML IJ SOLN: 30.0000 mg | Freq: Four times a day (QID) | INTRAMUSCULAR | Status: AC | PRN

## 2020-09-06 MED ORDER — COCONUT OIL OIL
1.0000 "application " | TOPICAL_OIL | Status: DC | PRN
  Administered 2020-09-07: 1 via TOPICAL

## 2020-09-06 MED ORDER — NALOXONE HCL 4 MG/10ML IJ SOLN
1.0000 ug/kg/h | INTRAVENOUS | Status: DC | PRN
  Filled 2020-09-06: qty 5

## 2020-09-06 MED ORDER — DIBUCAINE (PERIANAL) 1 % EX OINT: 1.0000 "application " | TOPICAL_OINTMENT | CUTANEOUS | Status: DC | PRN

## 2020-09-06 MED ORDER — SODIUM CHLORIDE 0.9 % IR SOLN
Status: DC | PRN
  Administered 2020-09-06: 1

## 2020-09-06 MED ORDER — MORPHINE SULFATE (PF) 0.5 MG/ML IJ SOLN
INTRAMUSCULAR | Status: AC
  Filled 2020-09-06: qty 10

## 2020-09-06 MED ORDER — SOD CITRATE-CITRIC ACID 500-334 MG/5ML PO SOLN
30.0000 mL | ORAL | Status: AC
  Administered 2020-09-06: 30 mL via ORAL
  Filled 2020-09-06: qty 30

## 2020-09-06 MED ORDER — ZOLPIDEM TARTRATE 5 MG PO TABS: 5.0000 mg | ORAL_TABLET | Freq: Every evening | ORAL | Status: DC | PRN

## 2020-09-06 MED ORDER — CEFAZOLIN SODIUM-DEXTROSE 2-4 GM/100ML-% IV SOLN
2.0000 g | INTRAVENOUS | Status: AC
  Administered 2020-09-06: 2 g via INTRAVENOUS

## 2020-09-06 MED ORDER — SODIUM CHLORIDE 0.9% FLUSH: 3.0000 mL | INTRAVENOUS | Status: DC | PRN

## 2020-09-06 MED ORDER — OXYTOCIN-SODIUM CHLORIDE 30-0.9 UT/500ML-% IV SOLN
INTRAVENOUS | Status: DC | PRN
  Administered 2020-09-06: 30 [IU] via INTRAVENOUS

## 2020-09-06 MED ORDER — NALOXONE HCL 0.4 MG/ML IJ SOLN: 0.4000 mg | INTRAMUSCULAR | Status: DC | PRN

## 2020-09-06 MED ORDER — DEXAMETHASONE SODIUM PHOSPHATE 10 MG/ML IJ SOLN
INTRAMUSCULAR | Status: AC
  Filled 2020-09-06: qty 1

## 2020-09-06 MED ORDER — DEXAMETHASONE SODIUM PHOSPHATE 10 MG/ML IJ SOLN
INTRAMUSCULAR | Status: DC | PRN
  Administered 2020-09-06: 10 mg via INTRAVENOUS

## 2020-09-06 MED ORDER — DIPHENHYDRAMINE HCL 25 MG PO CAPS
25.0000 mg | ORAL_CAPSULE | Freq: Four times a day (QID) | ORAL | Status: DC | PRN
  Administered 2020-09-07 (×2): 25 mg via ORAL

## 2020-09-06 MED ORDER — DIPHENHYDRAMINE HCL 50 MG/ML IJ SOLN: 12.5000 mg | INTRAMUSCULAR | Status: DC | PRN

## 2020-09-06 MED ORDER — ONDANSETRON HCL 4 MG/2ML IJ SOLN
INTRAMUSCULAR | Status: AC
  Filled 2020-09-06: qty 2

## 2020-09-06 MED ORDER — FENTANYL CITRATE (PF) 100 MCG/2ML IJ SOLN: 25.0000 ug | INTRAMUSCULAR | Status: DC | PRN

## 2020-09-06 MED ORDER — OXYTOCIN-SODIUM CHLORIDE 30-0.9 UT/500ML-% IV SOLN
1.0000 m[IU]/min | INTRAVENOUS | Status: DC
  Administered 2020-09-06: 2 m[IU]/min via INTRAVENOUS

## 2020-09-06 MED ORDER — SIMETHICONE 80 MG PO CHEW: 80.0000 mg | CHEWABLE_TABLET | ORAL | Status: DC | PRN

## 2020-09-06 MED ORDER — SENNOSIDES-DOCUSATE SODIUM 8.6-50 MG PO TABS
2.0000 | ORAL_TABLET | Freq: Every day | ORAL | Status: DC
  Administered 2020-09-07 – 2020-09-08 (×2): 2 via ORAL
  Filled 2020-09-06 (×2): qty 2

## 2020-09-06 SURGICAL SUPPLY — 35 items
BENZOIN TINCTURE PRP APPL 2/3 (GAUZE/BANDAGES/DRESSINGS) ×3 IMPLANT
CHLORAPREP W/TINT 26ML (MISCELLANEOUS) ×3 IMPLANT
CLAMP CORD UMBIL (MISCELLANEOUS) IMPLANT
CLOSURE WOUND 1/2 X4 (GAUZE/BANDAGES/DRESSINGS) ×1
CLOTH BEACON ORANGE TIMEOUT ST (SAFETY) ×3 IMPLANT
DRSG OPSITE POSTOP 4X10 (GAUZE/BANDAGES/DRESSINGS) ×3 IMPLANT
ELECT REM PT RETURN 9FT ADLT (ELECTROSURGICAL) ×3
ELECTRODE REM PT RTRN 9FT ADLT (ELECTROSURGICAL) ×1 IMPLANT
EXTRACTOR VACUUM M CUP 4 TUBE (SUCTIONS) IMPLANT
EXTRACTOR VACUUM M CUP 4' TUBE (SUCTIONS)
GLOVE BIO SURGEON STRL SZ7.5 (GLOVE) ×3 IMPLANT
GLOVE BIOGEL PI IND STRL 7.0 (GLOVE) ×1 IMPLANT
GLOVE BIOGEL PI IND STRL 7.5 (GLOVE) ×1 IMPLANT
GLOVE BIOGEL PI INDICATOR 7.0 (GLOVE) ×2
GLOVE BIOGEL PI INDICATOR 7.5 (GLOVE) ×2
GOWN STRL REUS W/TWL LRG LVL3 (GOWN DISPOSABLE) ×6 IMPLANT
KIT ABG SYR 3ML LUER SLIP (SYRINGE) IMPLANT
NEEDLE HYPO 25X5/8 SAFETYGLIDE (NEEDLE) IMPLANT
NS IRRIG 1000ML POUR BTL (IV SOLUTION) ×3 IMPLANT
PACK C SECTION WH (CUSTOM PROCEDURE TRAY) ×3 IMPLANT
PAD OB MATERNITY 4.3X12.25 (PERSONAL CARE ITEMS) ×3 IMPLANT
PENCIL SMOKE EVAC W/HOLSTER (ELECTROSURGICAL) ×3 IMPLANT
RTRCTR C-SECT PINK 25CM LRG (MISCELLANEOUS) ×3 IMPLANT
STRIP CLOSURE SKIN 1/2X4 (GAUZE/BANDAGES/DRESSINGS) ×2 IMPLANT
SUT CHROMIC 2 0 CT 1 (SUTURE) ×3 IMPLANT
SUT MNCRL AB 3-0 PS2 27 (SUTURE) ×3 IMPLANT
SUT PLAIN 2 0 XLH (SUTURE) ×3 IMPLANT
SUT VIC AB 0 CT1 36 (SUTURE) ×3 IMPLANT
SUT VIC AB 0 CTX 36 (SUTURE) ×6
SUT VIC AB 0 CTX36XBRD ANBCTRL (SUTURE) ×3 IMPLANT
SUT VIC AB 2-0 SH 27 (SUTURE)
SUT VIC AB 2-0 SH 27XBRD (SUTURE) IMPLANT
TOWEL OR 17X24 6PK STRL BLUE (TOWEL DISPOSABLE) ×3 IMPLANT
TRAY FOLEY W/BAG SLVR 14FR LF (SET/KITS/TRAYS/PACK) ×3 IMPLANT
WATER STERILE IRR 1000ML POUR (IV SOLUTION) ×3 IMPLANT

## 2020-09-06 NOTE — Progress Notes (Addendum)
Pt requested c-section and I was informed at sign out.  Tracing cat 1 and pt failed to progress and did not want pitocin restarted.  C-section scheduled for 0830.  Pt seen in room and confirms she would like to proceed with c-section.  Risks benefits and alternatives reviewed with the patient including but not limited to bleeding infection and injury.  Questions answered and consent signed and witnessed.  Pt and FOB did not have any questions.

## 2020-09-06 NOTE — Lactation Note (Signed)
This note was copied from a baby's chart. Lactation Consultation Note  Patient Name: Wendy Long [id number] Date: 09/06/2020 Reason for consult: Initial assessment;Term;Primapara;1st time breastfeeding Age:22 hours   Initial LC Consult:  Attempted to visit with mother, however, she is asleep at this time.  Will have evening shift LC return for an initial consult.    Maternal Data    Feeding Mother's Current Feeding Choice: Breast Milk and Formula  LATCH Score Latch: Too sleepy or reluctant, no latch achieved, no sucking elicited.  Audible Swallowing: None               Lactation Tools Discussed/Used    Interventions    Discharge    Consult Status Consult Status: Follow-up Date: 09/06/20 Follow-up type: In-patient    Jule Whitsel R Mirjana Tarleton 09/06/2020, 2:06 PM

## 2020-09-06 NOTE — Progress Notes (Signed)
CNM to bedside for SVE. No cervical change. Discussed restarting Pitocin and pt states she does not want to "stress the baby" and reqests a primary C/S. Informed Dr. Sallye Ober. MD advises to offer amnioinfusion, pt declines and states she would like a primary C/S. MD notified and POC to move towards a cesarean birth.   Rhea Pink, MSN, CNM 09/06/2020 7:41 AM

## 2020-09-06 NOTE — Transfer of Care (Signed)
Immediate Anesthesia Transfer of Care Note  Patient: Wendy Long  Procedure(s) Performed: CESAREAN SECTION  Patient Location: PACU  Anesthesia Type:Epidural  Level of Consciousness: awake, alert  and oriented  Airway & Oxygen Therapy: Patient Spontanous Breathing  Post-op Assessment: Report given to RN and Post -op Vital signs reviewed and stable  Post vital signs: Reviewed and stable  Last Vitals:  Vitals Value Taken Time  BP 124/67 09/06/20 1147  Temp 36.9 C 09/06/20 1147  Pulse 76 09/06/20 1147  Resp 18 09/06/20 1147  SpO2 99 % 09/06/20 1147    Last Pain:  Vitals:   09/06/20 1147  TempSrc:   PainSc: 3          Complications: No notable events documented.

## 2020-09-06 NOTE — Progress Notes (Signed)
Subjective:    Comfortable w/ epidural. Agrees to Pitocin augmentation.   Objective:    VS: BP 121/70   Pulse 80   Temp 98.4 F (36.9 C) (Oral)   Resp 18   Ht 5\' 7"  (1.702 m)   Wt 78.4 kg   LMP 11/10/2019   SpO2 97%   BMI 27.06 kg/m  FHR : baseline 135 / variability moderate / accelerations present / absent decelerations Toco: contractions every 2-4 minutes  Membranes: SROM x21 hrs, remains clear Dilation: 5.5 Effacement (%): 90 Station: 0 Presentation: Vertex Exam by:: [email or web address] RN   Assessment/Plan:   20 y.o. G1P0 [redacted]w[redacted]d  Labor:  slow progression, will augment w/ Pitocin as pt is nearing 24 hr ROM Fetal Wellbeing:  Category I Pain Control:  Epidural I/D:   GBS neg Anticipated MOD:  NSVD  [redacted]w[redacted]d MSN, CNM 09/06/2020 12:07 AM

## 2020-09-06 NOTE — Lactation Note (Signed)
This note was copied from a baby's chart. Lactation Consultation Note  Patient Name: Wendy Long [id number] Date: 09/06/2020 Reason for consult: Initial assessment;Term;Primapara;1st time breastfeeding Age:22 hours   P1 mother whose infant is now 5 hours old.  This is a term baby at 41+1 weeks.  Mother's feeding preference is breast/formula.  Baby was swaddled and asleep in the bassinet when I arrived.  Mother reported that she has tried to latch "4 times" since birth but baby has been sleepy.  Reassured mother that this is typical behavior and encouraged lots of STS and hand expression.    Reviewed breast feeding basics with parents.  Mother will feed 8-12 times/24 hours or sooner if baby shows feeding cues.  Encouraged mother to call for lactation assistance as needed.    Mom made aware of O/P services, breastfeeding support groups, community resources, and our phone # for post-discharge questions.  Mother has a DEBP for home use.     Maternal Data Has patient been taught Hand Expression?: Yes Does the patient have breastfeeding experience prior to this delivery?: No  Feeding Mother's Current Feeding Choice: Breast Milk and Formula Nipple Type: Extra Slow Flow  LATCH Score Latch: Too sleepy or reluctant, no latch achieved, no sucking elicited.  Audible Swallowing: None               Lactation Tools Discussed/Used    Interventions    Discharge Pump: Personal  Consult Status Consult Status: Follow-up Date: 09/07/20 Follow-up type: In-patient    Katlen Seyer R Breiona Couvillon 09/06/2020, 2:40 PM

## 2020-09-06 NOTE — Anesthesia Postprocedure Evaluation (Signed)
Anesthesia Post Note  Patient: Wendy Long  Procedure(s) Performed: CESAREAN SECTION     Patient location during evaluation: PACU Anesthesia Type: Epidural Level of consciousness: oriented and awake and alert Pain management: pain level controlled Vital Signs Assessment: post-procedure vital signs reviewed and stable Respiratory status: spontaneous breathing, respiratory function stable and patient connected to nasal cannula oxygen Cardiovascular status: blood pressure returned to baseline and stable Postop Assessment: no headache, no backache, no apparent nausea or vomiting and epidural receding Anesthetic complications: no   No notable events documented.  Last Vitals:  Vitals:   09/06/20 1130 09/06/20 1147  BP: 123/81 124/67  Pulse: 75 76  Resp: 12 18  Temp: 36.9 C 36.9 C  SpO2: 98% 99%    Last Pain:  Vitals:   09/06/20 1147  TempSrc:   PainSc: 3    Pain Goal:                   Schyler Counsell L Parminder Trapani

## 2020-09-06 NOTE — Progress Notes (Signed)
S: Comfortable w/ epidural. Discussed internal EFM and pt agrees.   O: FHR baseline: 135/mod variability/ 10x10/early and variable decels TOCO: 1-2 min Pitocin 2 mU/min  A/P: 20 y.o. G1P0 [redacted]w[redacted]d  Tachysystole     -Pitocin dc'd     -Terbutaline 0.25 SQ Cat 2     -resolved w/ position changed to left exaggerated sims IUPC and FSE placed Vaginal bleeding    -suspect abruption    -abd soft Will notify Dr. Sallye Ober.   Rhea Pink, MSN, CNM 09/06/2020 5:00 AM

## 2020-09-06 NOTE — Op Note (Signed)
Cesarean Section Procedure Note  Indications: 41 1/7wks with failure to progress and pt declined restarting pitocin with the night team and requested a c-section.  Pre-operative Diagnosis: 1.41 1/7wks 2.Failure to Progress   Post-operative Diagnosis: 1.41 1/7wks 2.Failure to Progress  Procedure: Primary Low Transverse Cesarean Section  Surgeon: Osborn Coho, MD    Assistants: Aldine Contes, CNM  Anesthesia: Regional  Anesthesiologist: Elmer Picker, MD   Procedure Details  The patient was taken to the operating room secondary to failure to progress after the risks, benefits, complications, treatment options, and expected outcomes were discussed with the patient.  The patient concurred with the proposed plan, giving informed consent which was signed and witnessed. The patient was taken to Operating Room C, identified as Wendy Long and the procedure verified as C-Section Delivery. A Time Out was held and the above information confirmed.  After induction of anesthesia by obtaining a spinal, the patient was prepped and draped in the usual sterile manner. A Pfannenstiel skin incision was made and carried down through the subcutaneous tissue to the underlying layer of fascia.  The fascia was incised bilaterally and extended transversely bilaterally with the Mayo scissors. Kocher clamps were placed on the inferior aspect of the fascial incision and the underlying rectus muscle was separated from the fascia. The same was done on the superior aspect of the fascial incision.  The peritoneum was identified, entered bluntly and extended manually.  An Alexis self-retaining retractor was placed.  The utero-vesical peritoneal reflection was incised transversely and the bladder flap was bluntly freed from the lower uterine segment. A low transverse uterine incision was made with the scalpel and extended bilaterally with the bandage scissors.  The infant was delivered in vertex position direct OP  without difficulty.  After the umbilical cord was clamped and cut, the infant was handed to the awaiting pediatricians.  Cord blood was obtained for evaluation.  The placenta was removed intact and appeared to be within normal limits. The uterus was cleared of all clots and debris. The uterine incision was closed with running interlocking sutures of 0 Vicryl and a second imbricating layer was performed as well.   Bilateral tubes and ovaries appeared to be within normal limits.  Good hemostasis was noted.  Copious irrigation was performed until clear.  The peritoneum was repaired with 2-0 chromic via a running suture.  The fascia was reapproximated with a running suture of 0 Vicryl. The subcutaneous tissue was reapproximated with 3 interrupted sutures of 2-0 plain.  The skin was reapproximated with a subcuticular suture of 3-0 monocryl.  Steristrips were applied.  Instrument, sponge, and needle counts were correct prior to abdominal closure and at the conclusion of the case.  The patient was awaiting transfer to the recovery room in good condition.  Findings: Live female infant with Apgars 9 at one minute and 9 at five minutes.  Normal appearing bilateral ovaries and fallopian tubes were noted.  Direct OP.  Estimated Blood Loss:  227 ml         Drains: foley to gravity 300 cc         Total IV Fluids: 2300 ml         Specimens to Pathology: none         Complications:  None; patient tolerated the procedure well.         Disposition: PACU - hemodynamically stable.         Condition: stable  Attending Attestation: I performed the procedure.  I  was present and scrubbed and the assistant was required due to complexity of anatomy.

## 2020-09-07 LAB — CBC
HCT: 27.2 % — ABNORMAL LOW (ref 36.0–46.0)
Hemoglobin: 9.5 g/dL — ABNORMAL LOW (ref 12.0–15.0)
MCH: 28.7 pg (ref 26.0–34.0)
MCHC: 34.9 g/dL (ref 30.0–36.0)
MCV: 82.2 fL (ref 80.0–100.0)
Platelets: 138 10*3/uL — ABNORMAL LOW (ref 150–400)
RBC: 3.31 MIL/uL — ABNORMAL LOW (ref 3.87–5.11)
RDW: 13.6 % (ref 11.5–15.5)
WBC: 15.8 10*3/uL — ABNORMAL HIGH (ref 4.0–10.5)
nRBC: 0 % (ref 0.0–0.2)

## 2020-09-07 LAB — BIRTH TISSUE RECOVERY COLLECTION (PLACENTA DONATION)

## 2020-09-07 NOTE — Progress Notes (Signed)
Subjective: Postpartum Day 1 Cesarean Delivery Patient reports tolerating PO, + flatus, and no problems voiding.    Objective: Vital signs in last 24 hours: Temp:  [97.7 F (36.5 C)-99.1 F (37.3 C)] 97.8 F (36.6 C) (06/29 0631) Pulse Rate:  [57-112] 74 (06/29 0631) Resp:  [12-18] 16 (06/29 0631) BP: (82-137)/(44-87) 120/70 (06/29 0631) SpO2:  [96 %-100 %] 97 % (06/29 0631)  Physical Exam:  General: alert, cooperative, appears stated age, and no distress Lochia: appropriate Uterine Fundus: firm Incision: incision C/D/I DVT Evaluation: No evidence of DVT seen on physical exam. Negative Homan's sign. No cords or calf tenderness. No significant calf/ankle edema.  Recent Labs    09/05/20 1503 09/07/20 0533  HGB 12.0 9.5*  HCT 37.1 27.2*    Assessment/Plan: Status post Cesarean section. Doing well postoperatively.  Continue current care Desires a shower. Small goals discussed today Pt looks great, plan for DC tomorrow pending babys status  Wendy Long B Codie Hainer 09/07/2020, 9:30 AM

## 2020-09-08 DIAGNOSIS — D62 Acute posthemorrhagic anemia: Secondary | ICD-10-CM | POA: Diagnosis not present

## 2020-09-08 DIAGNOSIS — Z98891 History of uterine scar from previous surgery: Secondary | ICD-10-CM

## 2020-09-08 MED ORDER — POLYSACCHARIDE IRON COMPLEX 150 MG PO CAPS: 150.0000 mg | ORAL_CAPSULE | Freq: Every day | ORAL | 2 refills | Status: AC

## 2020-09-08 MED ORDER — IBUPROFEN 600 MG PO TABS: 600.0000 mg | ORAL_TABLET | Freq: Four times a day (QID) | ORAL | 0 refills | Status: AC

## 2020-09-08 MED ORDER — POLYSACCHARIDE IRON COMPLEX 150 MG PO CAPS
150.0000 mg | ORAL_CAPSULE | Freq: Every day | ORAL | Status: DC
  Administered 2020-09-08: 150 mg via ORAL
  Filled 2020-09-08: qty 1

## 2020-09-08 MED ORDER — OXYCODONE HCL 5 MG PO TABS: 5.0000 mg | ORAL_TABLET | ORAL | 0 refills | Status: AC | PRN

## 2020-09-08 NOTE — Discharge Summary (Signed)
PCS OB Discharge Summary     Patient Name: Wendy Long DOB: [birth year] MRN: [id number]  Date of admission: 09/05/2020 Delivering MD: Osborn Coho  Date of delivery: 09/06/2020 PCS Type of delivery: PCS  Newborn Data: Sex: Baby female Live born female  Birth Weight: 7 lb (3175 g) APGAR: 9, 9  Newborn Delivery   Birth date/time: 09/06/2020 09:32:00 Delivery type: C-Section, Low Transverse C-section categorization: Primary      Feeding: breast and bottle Infant being discharge to home with mother in stable condition.   Admitting diagnosis: Normal labor [O80, Z37.9] Intrauterine pregnancy: [redacted]w[redacted]d     Secondary diagnosis:  Active Problems:   Normal labor   Acute blood loss anemia   S/P cesarean section                                Complications: None                                                              Intrapartum Procedures: cesarean: low cervical, transverse Postpartum Procedures: none Complications-Operative and Postpartum: none Augmentation: Pitocin   History of Present Illness: Ms. Wendy Long is a 20 y.o. female, G1P1001, who presents at [redacted]w[redacted]d weeks gestation. The patient has been followed at  [facility]  Her pregnancy has been complicated by:  Patient Active Problem List   Diagnosis Date Noted   Acute blood loss anemia 09/08/2020   S/P cesarean section 09/08/2020   Normal labor 09/05/2020   Sickle cell trait (HCC) 09/05/2020     Active Ambulatory Problems    Diagnosis Date Noted   Normal labor 09/05/2020   Sickle cell trait (HCC) 09/05/2020   Resolved Ambulatory Problems    Diagnosis Date Noted   No Resolved Ambulatory Problems   Past Medical History:  Diagnosis Date   Asthma      Hospital course:  Onset of Labor With Unplanned C/S   20 y.o. yo G1P1001 at [redacted]w[redacted]d was admitted in Latent Labor on 09/05/2020. Patient had a labor course significant for NRFHT and failure to progress past 6cm. . The  patient went for cesarean section due to Elective Primary and pt requsted elective PCS and declined pitocin for failure to progress.  . Delivery details as follows: Membrane Rupture Time/Date: 3:00 AM ,09/05/2020   Delivery Method:C-Section, Low Transverse  Details of operation can be found in separate operative note. Patient had an uncomplicated postpartum course.  She is ambulating,tolerating a regular diet, passing flatus, and urinating well.  Patient is discharged home in stable condition 09/08/20.  Newborn Data: Birth date:09/06/2020  Birth time:9:32 AM  Gender:Female  Living status:Living  Apgars:9 ,9  Weight:3175 g  POD# 3 :  Hospital Course--Unscheduled Cesarean:  Admitted 6/27 for latent labor at 6cm dilated at 41 weeks. Negative GBS.  Utilized epidural for pain management.  Due to failure to progress at 6cm , NRFHT on pitocin which resolved, she was consented for cesarean, with Dr. Su Hilt performing a primary LTCS under epidural anesthesia, with delivery of a viable baby female, with weight and Apgars as listed below. Infant was in good condition and remained at the patient's bedside.  The patient was taken to recovery in good condition.  Patient planned to bottle and breast feed.  On post-op day 1, patient was doing well, tolerating a regular diet, with Hgb of 12-9.5 with QBL of , asymptomatic on po iron.  Throughout her stay, her physical exam was WNL, her incision was CDI, and her vital signs remained stable.  By post-op day 1, she was up ad lib, tolerating a regular diet, with good pain control with po med.  She was deemed to have received the full benefit of her hospital stay, and was discharged home in stable condition.  Contraceptive choice was pills. Pt meets criteria for discharge and desires to go home, in stable condition     Physical exam  Vitals:   09/07/20 0631 09/07/20 1322 09/07/20 2222 09/08/20 0545  BP: 120/70 133/87 123/86 126/82  Pulse: 74 72 71 67  Resp: 16 18   18   Temp: 97.8 F (36.6 C) 98 F (36.7 C) 98.5 F (36.9 C) 98 F (36.7 C)  TempSrc: Oral Oral Oral Oral  SpO2: 97% 99%    Weight:      Height:       General: alert, cooperative, and no distress Lochia: appropriate Uterine Fundus: firm Incision: Healing well with no significant drainage, No significant erythema, Dressing is clean, dry, and intact, honeycomb dressing CDI Perineum: Intact DVT Evaluation: No evidence of DVT seen on physical exam. Negative Homan's sign. No cords or calf tenderness. No significant calf/ankle edema.  Labs: Lab Results  Component Value Date   WBC 15.8 (H) 09/07/2020   HGB 9.5 (L) 09/07/2020   HCT 27.2 (L) 09/07/2020   MCV 82.2 09/07/2020   PLT 138 (L) 09/07/2020   No flowsheet data found.  Date of discharge: 09/08/2020 Discharge Diagnoses: Term Pregnancy-delivered Discharge instruction: per After Visit Summary and "Baby and Me Booklet".  After visit meds:   Activity:           unrestricted and pelvic rest Advance as tolerated. Pelvic rest for 6 weeks.  Diet:                routine Medications: PNV, Ibuprofen, Colace, Iron, and oxy IR Postpartum contraception: Progesterone only pills Condition:  Pt discharge to home with baby in stable and condition Anemia: PO Iron   Meds: Allergies as of 09/08/2020   No Known Allergies      Medication List     STOP taking these medications    ondansetron 4 MG tablet Commonly known as: Zofran       TAKE these medications    ibuprofen 600 MG tablet Commonly known as: ADVIL Take 1 tablet (600 mg total) by mouth every 6 (six) hours.   iron polysaccharides 150 MG capsule Commonly known as: NIFEREX Take 1 capsule (150 mg total) by mouth daily. Start taking on: September 09, 2020   oxyCODONE 5 MG immediate release tablet Commonly known as: Oxy IR/ROXICODONE Take 1 tablet (5 mg total) by mouth every 4 (four) hours as needed for moderate pain.   PRENATAL VITAMINS PO Take 1 tablet by mouth  daily.               Discharge Care Instructions  (From admission, onward)           Start     Ordered   09/08/20 0000  Discharge wound care:       Comments: Take dressing off on day 5-7 postpartum.  Report increased drainage, redness or warmth. Clean with water, let soap trickle down body. Can leave steri strips  on until they fall off or take them off gently at day 10. Keep open to air, clean and dry.   09/08/20 1216            Discharge Follow Up:   Follow-up Information     [facility]. Schedule an appointment as soon as possible for a visit in 6 week(s).   Specialty: Obstetrics and Gynecology Contact information: [address] [place] [address] [phone]                 [place], NP-C, CNM 09/08/2020, 12:17 PM  Dale [place], FNP

## 2020-09-08 NOTE — Lactation Note (Signed)
This note was copied from a baby's chart. Lactation Consultation Note  Patient Name: Wendy Long [id number] Date: 09/08/2020 Reason for consult: Follow-up assessment Age:22 hours Mother paged for assistance to latch infant. 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 MGM reports that she has one as well.  Attempt to latch infant in cross cradle hold. Infant took a few sucks at a time in and off for a 10 min attempt.  Mother was fit with a #24 NS, infant was latched then tongue thrust on and off several times. Unable to sustain a good depth.  #20 was placed on and was a better fit. Observed infant for 5-10 mins . Observed milk in the shield. Mother taught how to use the shield and care for the shield.  Mother took infant off and reports that she will pump when she gets home . Suggested that she offer formula after feeding infant with the use of the NS. Mother is aware that she needs to pump after each feeding for 15 mins on each breast. Encouraged mother to keep trying to latch infant with and without the shield.   Discussed treatment and prevention of engorgement.   Advised mother to follow up with LC to do pre and post wt assessement. Also follow up with Peds Specialist to evaluate infants tongue.   Plan of Care : Breastfeed infant with feeding cues Supplement infant with ebm/formula, according to supplemental guidelines. Pump using a DEBP after each feeding for 15-20 mins.  WIC referral form was faxed to [facility] .   Mother to continue to cue base feed infant and feed at least 8-12 times or more in 24 hours and advised to allow for cluster feeding infant as needed.  Mother to continue to due STS. Mother is aware of available LC services at [facility], BFSG'S, OP Dept, and phone # for questions or concerns about breastfeeding.  Mother receptive to all teaching and plan of care.    Maternal Data    Feeding Mother's Current  Feeding Choice: Breast Milk and Formula Nipple Type: Extra Slow Flow  LATCH Score Latch: Repeated attempts needed to sustain latch, nipple held in mouth throughout feeding, stimulation needed to elicit sucking reflex.  Audible Swallowing: A few with stimulation  Type of Nipple: Everted at rest and after stimulation  Comfort (Breast/Nipple): Soft / non-tender  Hold (Positioning): Assistance needed to correctly position infant at breast and maintain latch.  LATCH Score: 7   Lactation Tools Discussed/Used Tools: Nipple Shields Nipple shield size: 20;24 Breast pump type: Manual Pump Education: Setup, frequency, and cleaning;Milk Storage Reason for Pumping: lactation induction Pumping frequency: q 3 hrs for 15 mins on each breast  Interventions Interventions: Adjust position;Breast compression;Support pillows;Position options;Breast massage;Hand express;Assisted with latch;Breast feeding basics reviewed;Skin to skin;DEBP  Discharge Discharge Education: Engorgement and breast care;Warning signs for feeding baby;Outpatient recommendation Pump: Personal;Manual ([employer] was a gift) WIC Program: Yes  Consult Status Consult Status: Complete Date: 09/08/20 Follow-up type: In-patient    Stevan Born [facility] 09/08/2020, 2:34 PM

## 2020-09-16 ENCOUNTER — Telehealth (HOSPITAL_COMMUNITY): Payer: Self-pay | Admitting: *Deleted

## 2020-09-16 NOTE — Telephone Encounter (Signed)
Mom reports feeling a little anxious. Called OB office yesterday, but hasn't heard back yet due to swelling at incision site. Dsg removed yesterday when she noticed swelling. Reports some stinging at site, but no redness, oozing, warmth, pain or fever. Suggested she call office back if she hasn't heard from them by noon so OB can guide her regarding incision. Mom agrees. EPDS = 10. Faxed notification to Dr. Osborn Coho. Mom reports baby is doing great. Gaining weight. Breastfeeding & formula feeding well. No concerns regarding baby.

## 2021-02-15 ENCOUNTER — Other Ambulatory Visit: Payer: Self-pay

## 2021-10-08 IMAGING — US US MFM OB FOLLOW-UP
1 series · 13 of 28 positions shown · non-contrast
Comparison: none

[Series 1: us mfm ob follow-up · 13 of 47 slices shown]
[im 2/47]
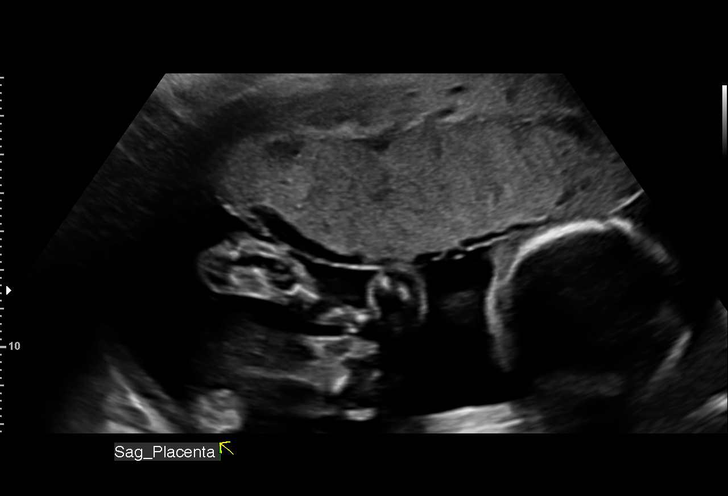
[im 6/47]
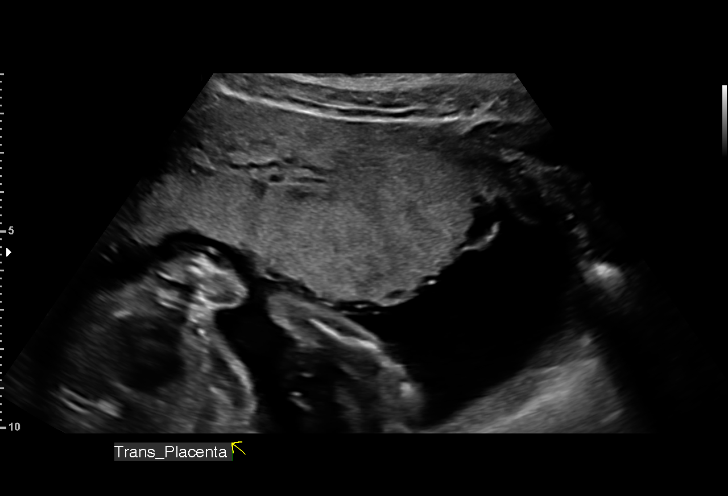
[im 9/47]
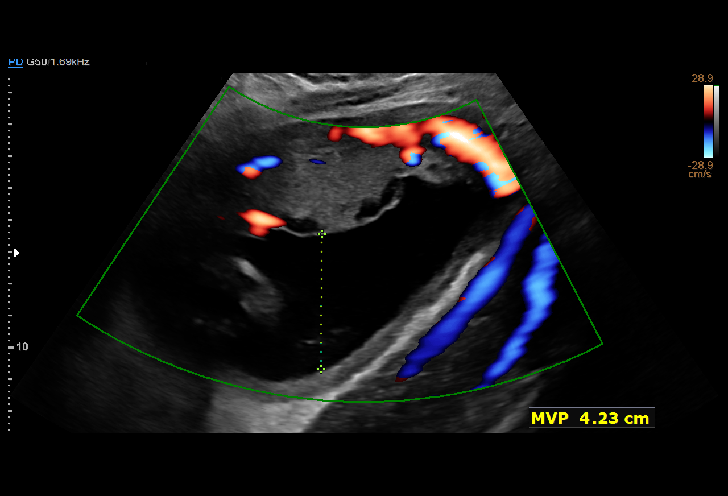
[im 12/47]
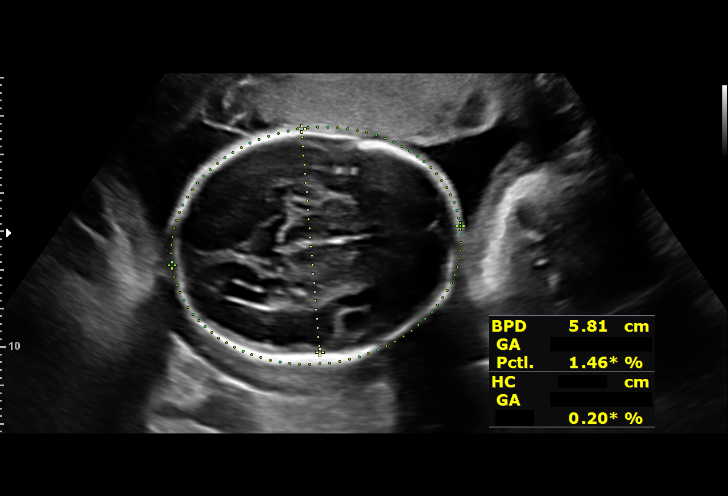
[im 16/47]
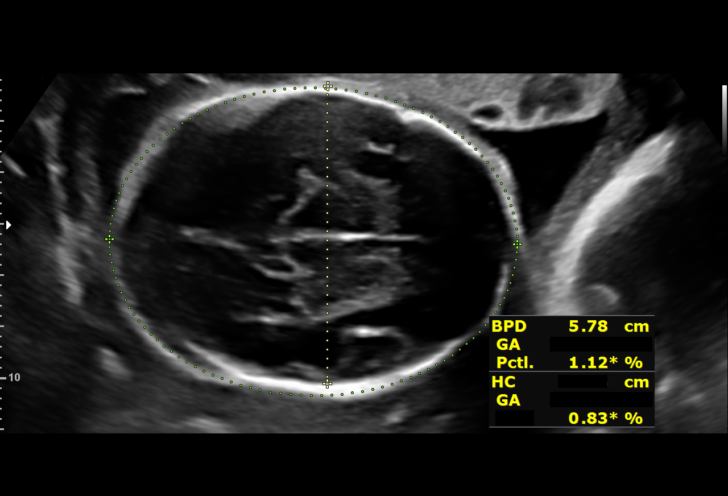
[im 19/47]
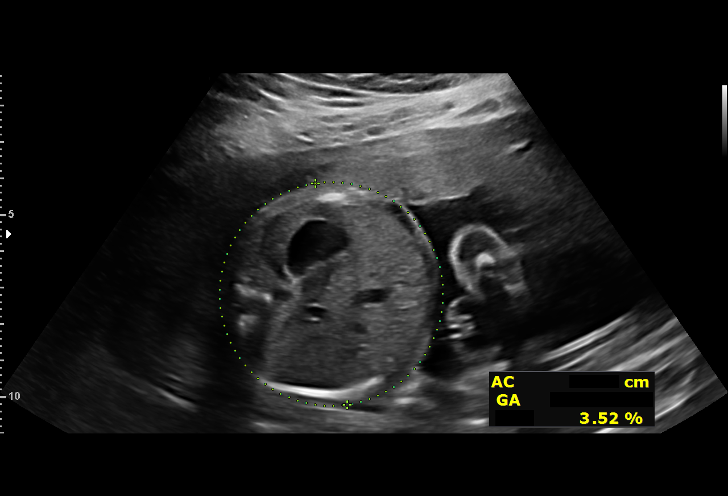
[im 24/47]
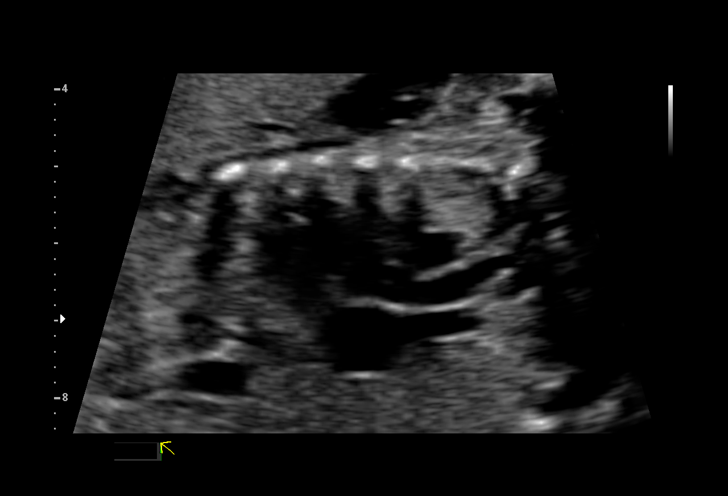
[im 28/47]
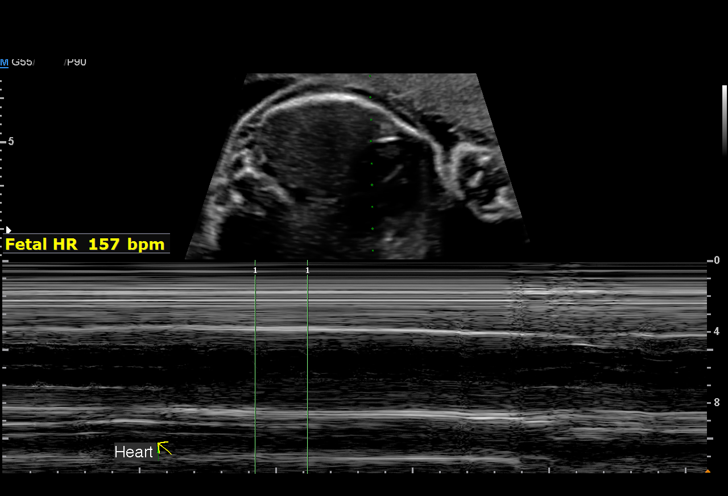
[im 31/47]
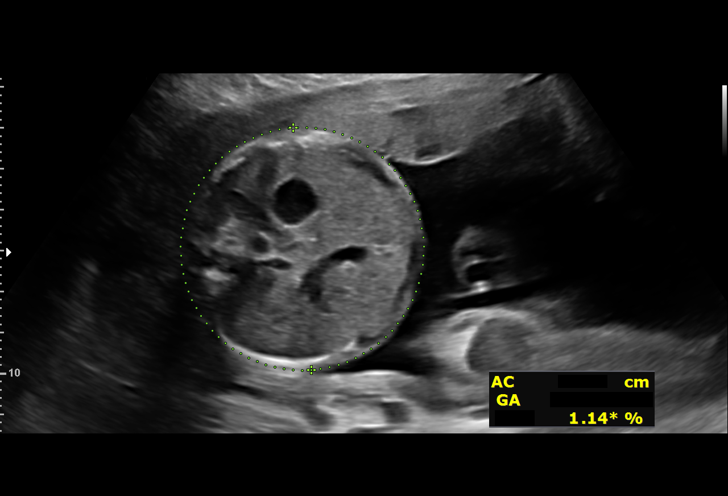
[im 35/47]
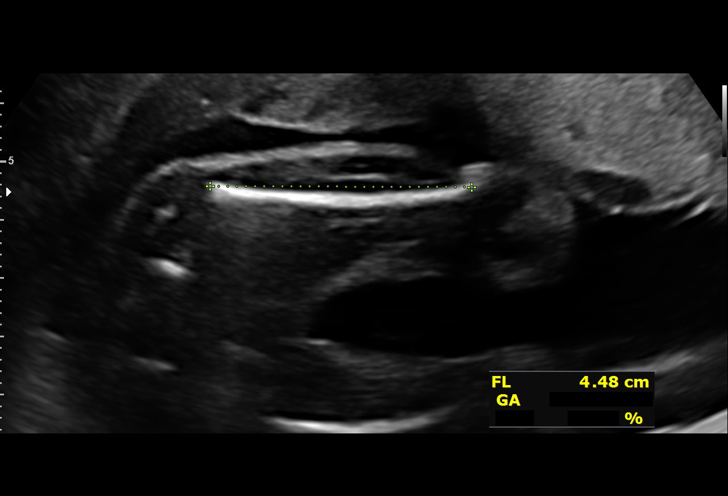
[im 38/47]
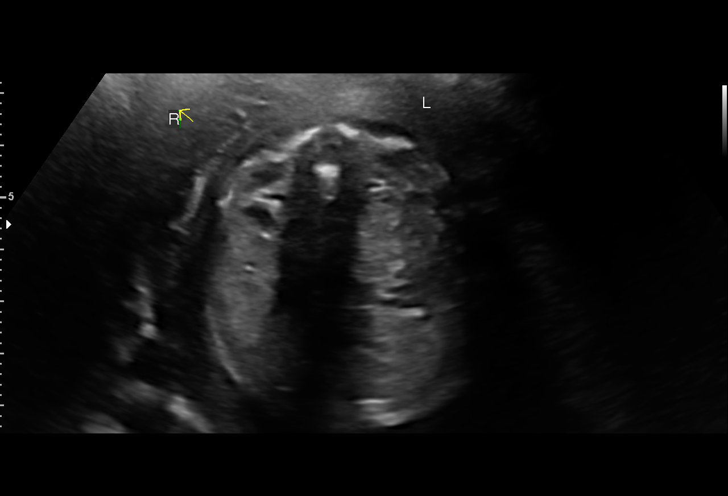
[im 41/47]
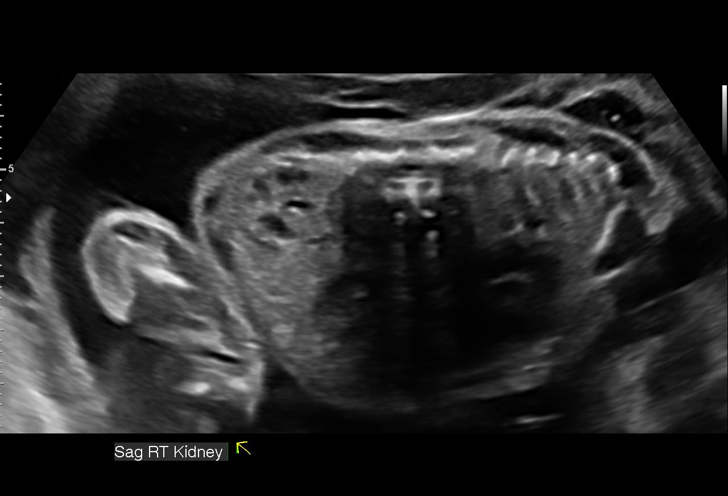
[im 45/47]
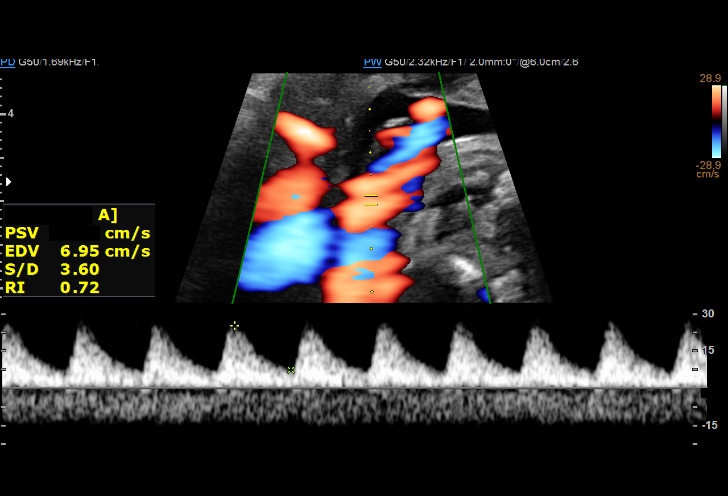

[13 of 28 positions shown; findings below may reference images not displayed]

Obstetrics &
                                                            Gynecology
                                                            [id number]
                                                            [place]
                   CNM

Indications

 24 weeks gestation of pregnancy
 Maternal care for known or suspected poor
 fetal growth, second trimester, fetus 1 IUGR
 (on outside office scan)
 History of sickle cell trait
 Low-risk NIPS
Fetal Evaluation

 Num Of Fetuses:         1
 Fetal Heart Rate(bpm):  157
 Cardiac Activity:       Observed
 Presentation:           Cephalic
 Placenta:               Anterior
 P. Cord Insertion:      Visualized

 Amniotic Fluid
 AFI FV:      Within normal limits

                             Largest Pocket(cm)

Biometry
 BPD:      58.1  mm     G. Age:  23w 6d         36  %    CI:        72.35   %    70 - 86
                                                         FL/HC:      20.2   %    18.7 -
 HC:      217.3  mm     G. Age:  23w 5d         24  %    HC/AC:      1.12        1.05 -
 AC:      193.6  mm     G. Age:  24w 0d         43  %    FL/BPD:     75.7   %    71 - 87
 FL:         44  mm     G. Age:  24w 3d         53  %    FL/AC:      22.7   %    20 - 24
 LV:        3.4  mm

 Est. FW:     668  gm      1 lb 8 oz     49  %
OB History

 Gravidity:    1         Term:   0        Prem:   0        SAB:   0
 TOP:          0       Ectopic:  0        Living: 0
Gestational Age

 LMP:           25w 6d        Date:  11/10/19                 EDD:   08/16/20
 U/S Today:     24w 0d                                        EDD:   08/29/20
 Best:          24w 0d     Det. By:  U/S  (04/06/20)          EDD:   08/29/20
Anatomy

 Cranium:               Appears normal         Aortic Arch:            Previously seen
 Cavum:                 Appears normal         Ductal Arch:            Previously seen
 Ventricles:            Appears normal         Diaphragm:              Appears normal
 Choroid Plexus:        Previously seen        Stomach:                Appears normal, left
                                                                       sided
 Cerebellum:            Previously seen        Abdomen:                Appears normal
 Posterior Fossa:       Previously seen        Abdominal Wall:         Previously seen
 Nuchal Fold:           Previously seen        Cord Vessels:           Previously seen
 Face:                  Orbits and profile     Kidneys:                Appear normal
                        previously seen
 Lips:                  Previously seen        Bladder:                Appears normal
 Thoracic:              Appears normal         Spine:                  Previously seen
 Heart:                 Previously seen        Upper Extremities:      Previously seen
 RVOT:                  Appears normal         Lower Extremities:      Previously seen
 LVOT:                  Appears normal

 Other:  SVC IVC prev appears normal. 3VV/T not well visualized. Heels prev
         visualized. 5th digit prev visualized. Fetus appears to be female.
Cervix Uterus Adnexa

 Cervix
 Not visualized (advanced GA >38wks)

 Uterus
 No abnormality visualized.

 Right Ovary
 Not visualized.

 Left Ovary
 Within normal limits.
 Cul De Sac
 No free fluid seen.

 Adnexa
 No adnexal mass visualized.
Impression

 Patient returned for completion of fetal anatomy .Fetal growth
 is appropriate for gestational age .Good interval growth is
 seen.Amniotic fluid is normal and good fetal activity is seen
 .Fetal anatomical survey was completed and appears normal.
 Patient reports she has "runny nose" and cough but tested
 negative for [id number] infection.

 She has sickle-cell trait. I recommended partner screening.
 Patient opted not to have genetic counseling.

 EDD: 08/29/2020
Recommendations

 Follow-up scans as clinically indicated.
                 Louie, Violetta
